# Patient Record
Sex: Female | Born: 1960 | Race: White | Hispanic: No | Marital: Single | Smoking: Former smoker
Health system: Southern US, Community
[De-identification: ages and names within clinical notes are randomized; demographics above are authoritative.]

## PROBLEM LIST (undated history)

## (undated) DIAGNOSIS — E079 Disorder of thyroid, unspecified: Secondary | ICD-10-CM

## (undated) DIAGNOSIS — I10 Essential (primary) hypertension: Secondary | ICD-10-CM

## (undated) DIAGNOSIS — F419 Anxiety disorder, unspecified: Secondary | ICD-10-CM

## (undated) DIAGNOSIS — F32A Depression, unspecified: Secondary | ICD-10-CM

## (undated) DIAGNOSIS — T7840XA Allergy, unspecified, initial encounter: Secondary | ICD-10-CM

## (undated) HISTORY — DX: Disorder of thyroid, unspecified: E07.9

## (undated) HISTORY — DX: Essential (primary) hypertension: I10

## (undated) HISTORY — DX: Depression, unspecified: F32.A

## (undated) HISTORY — DX: Allergy, unspecified, initial encounter: T78.40XA

## (undated) HISTORY — PX: CHOLECYSTECTOMY: SHX55

## (undated) HISTORY — DX: Anxiety disorder, unspecified: F41.9

## (undated) HISTORY — PX: APPENDECTOMY: SHX54

---

## 2016-04-23 ENCOUNTER — Ambulatory Visit: Payer: BLUE CROSS/BLUE SHIELD | Admitting: Internal Medicine

## 2016-04-26 ENCOUNTER — Other Ambulatory Visit (INDEPENDENT_AMBULATORY_CARE_PROVIDER_SITE_OTHER): Payer: BLUE CROSS/BLUE SHIELD

## 2016-04-26 ENCOUNTER — Ambulatory Visit (INDEPENDENT_AMBULATORY_CARE_PROVIDER_SITE_OTHER): Payer: BLUE CROSS/BLUE SHIELD | Admitting: Internal Medicine

## 2016-04-26 ENCOUNTER — Encounter: Payer: Self-pay | Admitting: Internal Medicine

## 2016-04-26 VITALS — BP 112/76 | HR 58 | Temp 98.2°F | Resp 18 | Ht 67.5 in | Wt 227.0 lb

## 2016-04-26 DIAGNOSIS — J301 Allergic rhinitis due to pollen: Secondary | ICD-10-CM

## 2016-04-26 DIAGNOSIS — F3289 Other specified depressive episodes: Secondary | ICD-10-CM

## 2016-04-26 DIAGNOSIS — R740 Nonspecific elevation of levels of transaminase and lactic acid dehydrogenase [LDH]: Secondary | ICD-10-CM | POA: Diagnosis not present

## 2016-04-26 DIAGNOSIS — Z1159 Encounter for screening for other viral diseases: Secondary | ICD-10-CM | POA: Diagnosis not present

## 2016-04-26 DIAGNOSIS — R7401 Elevation of levels of liver transaminase levels: Secondary | ICD-10-CM

## 2016-04-26 DIAGNOSIS — E039 Hypothyroidism, unspecified: Secondary | ICD-10-CM

## 2016-04-26 LAB — CORTISOL: CORTISOL PLASMA: 4.7 ug/dL

## 2016-04-26 LAB — COMPREHENSIVE METABOLIC PANEL
ALBUMIN: 4.2 g/dL (ref 3.5–5.2)
ALT: 57 U/L — ABNORMAL HIGH (ref 0–35)
AST: 32 U/L (ref 0–37)
Alkaline Phosphatase: 133 U/L — ABNORMAL HIGH (ref 39–117)
BUN: 22 mg/dL (ref 6–23)
CHLORIDE: 101 meq/L (ref 96–112)
CO2: 31 mEq/L (ref 19–32)
CREATININE: 1.08 mg/dL (ref 0.40–1.20)
Calcium: 9.7 mg/dL (ref 8.4–10.5)
GFR: 55.72 mL/min — ABNORMAL LOW (ref >60.00)
GLUCOSE: 101 mg/dL — AB (ref 70–99)
Potassium: 4.2 mEq/L (ref 3.5–5.1)
SODIUM: 139 meq/L (ref 135–145)
TOTAL PROTEIN: 7.4 g/dL (ref 6.0–8.3)
Total Bilirubin: 0.3 mg/dL (ref 0.2–1.2)

## 2016-04-26 LAB — TSH: TSH: 4.13 u[IU]/mL (ref 0.35–4.50)

## 2016-04-26 LAB — HEMOGLOBIN A1C: Hgb A1c MFr Bld: 6.1 % (ref 4.6–6.5)

## 2016-04-26 MED ORDER — MONTELUKAST SODIUM 10 MG PO TABS: 10.0000 mg | ORAL_TABLET | Freq: Every day | ORAL | 3 refills | Status: DC

## 2016-04-26 NOTE — Progress Notes (Signed)
   Subjective:    Patient ID: Kristen Rowe, female    DOB: 02-07-1961, 55 y.o.   MRN: [id number]  HPI The patient is a 55 YO female coming in new for sinus problems. She has had them in the past. Normally she will take nose spray over the counter and is still taking that but it is not helping. She is having drainage. Mild cough, no SOB. No ear pain or drainage. No fevers or chills. Going on since she moved here from [place]. 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 Also with some new ALT elevation on recent labs (does not drink alcohol, had all childhood immunizations that she knows of, never had follow up of this, taking some tylenol but not extra before labs).  PMH, [facility], social history reviewed and updated.   Review of Systems  Constitutional: Negative for activity change, appetite change, chills, fatigue, fever and unexpected weight change.  HENT: Positive for congestion and postnasal drip. Negative for ear discharge, ear pain, facial swelling, rhinorrhea, sinus pain, sinus pressure, sore throat and trouble swallowing.   Eyes: Negative.   Respiratory: Positive for cough. Negative for chest tightness, shortness of breath and wheezing.   Cardiovascular: Negative for chest pain, palpitations and leg swelling.  Gastrointestinal: Negative for abdominal distention, abdominal pain, constipation, diarrhea and nausea.  Musculoskeletal: Negative.   Skin: Negative.   Neurological: Negative.   Psychiatric/Behavioral: Negative.       Objective:   Physical Exam  Constitutional:  She is oriented to person, place, and time. She appears well-developed and well-nourished.  HENT:  Head: Normocephalic and atraumatic.  Bilateral TM normal, oropharynx with mild clear drainage, nose without crusting.   Eyes: EOM are normal.  Neck: Normal range of motion. No JVD present.  Cardiovascular: Normal rate and regular rhythm.   Pulmonary/Chest: Effort normal and breath sounds normal. No respiratory distress. She has no wheezes.  Abdominal: Soft. Bowel sounds are normal. She exhibits no distension. There is no tenderness. There is no rebound.  Musculoskeletal: She exhibits no edema.  Lymphadenopathy:    She has no cervical adenopathy.  Neurological: She is alert and oriented to person, place, and time. Coordination normal.  Skin: Skin is warm and dry.  Psychiatric: She has a normal mood and affect.   Vitals:   04/26/16 1425  BP: 112/76  Pulse: (!) 58  Resp: 18  Temp: 98.2 F (36.8 C)  TempSrc: Oral  SpO2: 98%  Weight: 227 lb (103 kg)  Height: 5' 7.5" (1.715 m)      Assessment & Plan:

## 2016-04-26 NOTE — Progress Notes (Signed)
Pre visit review using our clinic review tool, if applicable. No additional management support is needed unless otherwise documented below in the visit note. 

## 2016-04-26 NOTE — Patient Instructions (Signed)
We have sent in singulair for the allegies. Take 1 pill daily to see if this helps some.  Once you are feeling better consider doing some local honey (check labels but most of the regular honey in the grocery store is collected locally) several times per week to help decrease allergy symptoms.  We are checking the labs for the liver and the hepatitis to see if you need the vaccine.

## 2016-04-27 DIAGNOSIS — F32A Depression, unspecified: Secondary | ICD-10-CM | POA: Insufficient documentation

## 2016-04-27 DIAGNOSIS — F329 Major depressive disorder, single episode, unspecified: Secondary | ICD-10-CM | POA: Insufficient documentation

## 2016-04-27 DIAGNOSIS — R740 Nonspecific elevation of levels of transaminase and lactic acid dehydrogenase [LDH]: Secondary | ICD-10-CM

## 2016-04-27 DIAGNOSIS — R7401 Elevation of levels of liver transaminase levels: Secondary | ICD-10-CM | POA: Insufficient documentation

## 2016-04-27 DIAGNOSIS — E039 Hypothyroidism, unspecified: Secondary | ICD-10-CM | POA: Insufficient documentation

## 2016-04-27 DIAGNOSIS — J309 Allergic rhinitis, unspecified: Secondary | ICD-10-CM | POA: Insufficient documentation

## 2016-04-27 LAB — HEPATITIS B SURFACE ANTIBODY,QUALITATIVE: HEP B S AB: NEGATIVE

## 2016-04-27 LAB — HEPATITIS C ANTIBODY: HCV AB: NEGATIVE

## 2016-04-27 NOTE — Assessment & Plan Note (Signed)
Has tried otc zyrtec and nasonex with some relief. Will add singulair to see if this adds more benefit. No indication for antibiotics or steroids at this time.

## 2016-04-27 NOTE — Assessment & Plan Note (Signed)
Recent TSH at goal on synthroid 137 mcg so will continue synthroid daily.

## 2016-04-27 NOTE — Assessment & Plan Note (Addendum)
Elevated on recent labs we reviewed, she does not drink alcohol. Checking screening hep c and hep b immunity and recheck the levels. Could be related to some fatty liver but needs evaluation.

## 2016-04-27 NOTE — Assessment & Plan Note (Signed)
Controlled on wellbutrin and celexa and doing well.

## 2016-04-28 ENCOUNTER — Other Ambulatory Visit: Payer: Self-pay | Admitting: Internal Medicine

## 2016-04-28 DIAGNOSIS — R5383 Other fatigue: Secondary | ICD-10-CM

## 2016-06-16 ENCOUNTER — Telehealth: Payer: Self-pay | Admitting: Internal Medicine

## 2016-06-16 ENCOUNTER — Encounter: Payer: Self-pay | Admitting: Internal Medicine

## 2016-06-16 ENCOUNTER — Ambulatory Visit (INDEPENDENT_AMBULATORY_CARE_PROVIDER_SITE_OTHER): Payer: BLUE CROSS/BLUE SHIELD | Admitting: Internal Medicine

## 2016-06-16 VITALS — BP 136/80 | HR 67 | Temp 98.9°F | Resp 20 | Wt 227.0 lb

## 2016-06-16 DIAGNOSIS — J019 Acute sinusitis, unspecified: Secondary | ICD-10-CM | POA: Diagnosis not present

## 2016-06-16 DIAGNOSIS — J301 Allergic rhinitis due to pollen: Secondary | ICD-10-CM | POA: Diagnosis not present

## 2016-06-16 MED ORDER — HYDROCODONE-HOMATROPINE 5-1.5 MG/5ML PO SYRP: 5.0000 mL | ORAL_SOLUTION | Freq: Four times a day (QID) | ORAL | 0 refills | Status: AC | PRN

## 2016-06-16 MED ORDER — LEVOFLOXACIN 500 MG PO TABS: 500.0000 mg | ORAL_TABLET | Freq: Every day | ORAL | 0 refills | Status: DC

## 2016-06-16 NOTE — Assessment & Plan Note (Signed)
Mild to mod, for antibx course,  to f/u any worsening symptoms or concerns 

## 2016-06-16 NOTE — Telephone Encounter (Signed)
Received a phone call from the answering service, pharmacy report a interaction between Levaquin and citalopram. I advised them to hold  the prescription for now, will send his message to Dr. Cathlean Cower who prescribed Levaquin to see if he likes to send an alternative antibiotic.

## 2016-06-16 NOTE — Progress Notes (Signed)
   Subjective:    Patient ID: Kristen Rowe, female    DOB: 1960-08-15, 56 y.o.   MRN: [id number]  HPI   Here with 2-3 days acute onset fever, facial pain and severe left ear pain, pressure, headache, general weakness and malaise, and greenish d/c, with mild ST and cough, but pt denies chest pain, wheezing, increased sob or doe, orthopnea, PND, increased LE swelling, palpitations, dizziness or syncope. Pt denies new neurological symptoms such as new headache, or facial or extremity weakness or numbness   Pt denies polydipsia, polyuria  Allergy symptoms continue to be well controlled with singulair, asks for refill  No other new history Past Medical History:  Diagnosis Date  . Thyroid disease    Past Surgical History:  Procedure Laterality Date  . APPENDECTOMY    . CHOLECYSTECTOMY      reports that she quit smoking about 7 weeks ago. She has never used smokeless tobacco. She reports that she does not drink alcohol or use drugs. family history includes Cancer in her mother; Heart disease in her father and mother; Hypertension in her brother, father, mother, and sister. No Known Allergies Current Outpatient Prescriptions on File Prior to Visit  Medication Sig Dispense Refill  . buPROPion (WELLBUTRIN XL) 150 MG 24 hr tablet Take 150 mg by mouth daily.   0  . citalopram (CELEXA) 20 MG tablet Take 20 mg by mouth daily.   0  . lisinopril-hydrochlorothiazide (PRINZIDE,ZESTORETIC) 10-12.5 MG tablet Take 1 tablet by mouth daily.   0  . metoprolol (LOPRESSOR) 50 MG tablet Take 50 mg by mouth 2 (two) times daily.    . montelukast (SINGULAIR) 10 MG tablet Take 1 tablet (10 mg total) by mouth at bedtime. 30 tablet 3  . SYNTHROID 137 MCG tablet Take 137 mcg by mouth daily before breakfast.   2  . zolpidem (AMBIEN) 5 MG tablet Take 10 mg by mouth at bedtime as needed for sleep.     No current facility-administered medications on file prior to visit.    Review of Systems All other system neg per pt      Objective:   Physical Exam BP 136/80   Pulse 67   Temp 98.9 F (37.2 C) (Oral)   Resp 20   Wt 227 lb (103 kg)   SpO2 96%   BMI 35.03 kg/m  VS noted, mild ill Constitutional: Pt appears in no apparent distress HENT: Head: NCAT.  Right Ear: External ear normal.  Left Ear: External ear normal.  Bilat tm's with mild to mod erythema left > right.  Max sinus areas mild tender.  Pharynx with mild erythema, no exudate Eyes: . Pupils are equal, round, and reactive to light. Conjunctivae and EOM are normal Neck: Normal range of motion. Neck supple.  Cardiovascular: Normal rate and regular rhythm.   Pulmonary/Chest: Effort normal and breath sounds decreased without rales or wheezing.  Neurological: Pt is alert. Not confused , motor grossly intact Skin: Skin is warm. No rash, no LE edema Psychiatric: Pt behavior is normal. No agitation.  No other new exam findings    Assessment & Plan:

## 2016-06-16 NOTE — Patient Instructions (Signed)
Please take all new medication as prescribed - the antibiotic, and cough medicine if needed  You can also take Mucinex (or it's generic off brand) for congestion, and tylenol as needed for pain.  Please continue all other medications as before, and refills have been done if requested.  Please have the pharmacy call with any other refills you may need.  Please keep your appointments with your specialists as you may have planned   

## 2016-06-16 NOTE — Telephone Encounter (Signed)
Ok to continue the [employer] to notify pharmacy please

## 2016-06-16 NOTE — Progress Notes (Signed)
Pre visit review using our clinic review tool, if applicable. No additional management support is needed unless otherwise documented below in the visit note. 

## 2016-06-17 ENCOUNTER — Encounter: Payer: Self-pay | Admitting: Internal Medicine

## 2016-06-17 NOTE — Telephone Encounter (Signed)
Called pharmacy they are supposed to have the pharmacist call me back.

## 2016-06-18 ENCOUNTER — Telehealth: Payer: Self-pay

## 2016-06-18 MED ORDER — LEVOFLOXACIN 500 MG PO TABS: 500.0000 mg | ORAL_TABLET | Freq: Every day | ORAL | 0 refills | Status: AC

## 2016-06-18 NOTE — Telephone Encounter (Signed)
Medication re sent to pharmacy in [place].

## 2016-06-29 ENCOUNTER — Other Ambulatory Visit: Payer: Self-pay

## 2016-06-29 ENCOUNTER — Other Ambulatory Visit: Payer: Self-pay | Admitting: Internal Medicine

## 2016-06-29 ENCOUNTER — Encounter: Payer: Self-pay | Admitting: Internal Medicine

## 2016-06-29 MED ORDER — ZOLPIDEM TARTRATE 5 MG PO TABS: 10.0000 mg | ORAL_TABLET | Freq: Every evening | ORAL | 0 refills | Status: DC | PRN

## 2016-06-29 NOTE — Telephone Encounter (Signed)
Request for zolpidem 10mg  paper request sent in by [place].

## 2016-07-02 MED ORDER — METOPROLOL TARTRATE 50 MG PO TABS: 50.0000 mg | ORAL_TABLET | Freq: Two times a day (BID) | ORAL | 0 refills | Status: DC

## 2016-07-02 MED ORDER — LISINOPRIL-HYDROCHLOROTHIAZIDE 10-12.5 MG PO TABS: 1.0000 | ORAL_TABLET | Freq: Every day | ORAL | 0 refills | Status: DC

## 2016-07-02 MED ORDER — BUPROPION HCL ER (XL) 150 MG PO TB24: 150.0000 mg | ORAL_TABLET | Freq: Every day | ORAL | 0 refills | Status: DC

## 2016-07-09 ENCOUNTER — Encounter: Payer: Self-pay | Admitting: Internal Medicine

## 2016-07-09 MED ORDER — CITALOPRAM HYDROBROMIDE 20 MG PO TABS: 20.0000 mg | ORAL_TABLET | Freq: Every day | ORAL | 1 refills | Status: DC

## 2016-08-04 ENCOUNTER — Other Ambulatory Visit: Payer: Self-pay | Admitting: Internal Medicine

## 2016-08-04 ENCOUNTER — Encounter: Payer: Self-pay | Admitting: Internal Medicine

## 2016-08-06 ENCOUNTER — Other Ambulatory Visit: Payer: Self-pay | Admitting: Internal Medicine

## 2016-08-23 ENCOUNTER — Ambulatory Visit: Payer: BLUE CROSS/BLUE SHIELD | Admitting: Internal Medicine

## 2016-08-26 ENCOUNTER — Ambulatory Visit (INDEPENDENT_AMBULATORY_CARE_PROVIDER_SITE_OTHER): Payer: BLUE CROSS/BLUE SHIELD | Admitting: Internal Medicine

## 2016-08-26 ENCOUNTER — Encounter: Payer: Self-pay | Admitting: Internal Medicine

## 2016-08-26 DIAGNOSIS — M25571 Pain in right ankle and joints of right foot: Secondary | ICD-10-CM

## 2016-08-26 MED ORDER — ZOLPIDEM TARTRATE 10 MG PO TABS: 10.0000 mg | ORAL_TABLET | Freq: Every evening | ORAL | 2 refills | Status: DC | PRN

## 2016-08-26 NOTE — Patient Instructions (Signed)
We will get you in with Dr. Tamala Julian as soon as possible to help the ankle and look at all the tendons.

## 2016-08-26 NOTE — Progress Notes (Signed)
   Subjective:    Patient ID: Kristen Rowe, female    DOB: [birth year], 56 y.o.   MRN: [id number]  HPI The patient is a 56 YO female coming in for right foot and ankle pain for about 2 years. Getting worse in the last month or so which caused her to come in. Denies any injury in the last month that she is aware of. She has broken the tibia and fibular with repair back in 2013 or 2014 (not sure which) and then hardware removal around 2016. She also tore her left achilles tendon (without known provoked injury) and underwent recovery (no surgery to fix). This foot in now normal and not hurting. Her pain in the right ankle is on the medial aspect of the ankle itself as well as along the achilles tendon and into the calf. No swelling or rash. Worse with walking and worse with extension of the foot. She is taking otc meds for pain when needed and doing okay. She is not able to be off her feet with her job.   Review of Systems  Constitutional: Positive for activity change. Negative for appetite change, chills, fatigue, fever and unexpected weight change.  Respiratory: Negative.   Cardiovascular: Negative.   Gastrointestinal: Negative.   Musculoskeletal: Positive for arthralgias, gait problem and myalgias. Negative for back pain, joint swelling, neck pain and neck stiffness.  Skin: Negative.   Neurological: Negative for dizziness, facial asymmetry, weakness, light-headedness, numbness and headaches.      Objective:   Physical Exam  Constitutional: She appears well-developed and well-nourished.  HENT:  Head: Normocephalic and atraumatic.  Cardiovascular: Normal rate and regular rhythm.   Pulmonary/Chest: Effort normal.  Abdominal: Soft.  Musculoskeletal: She exhibits tenderness.  Pain along the medial right ankle and into the achilles tendon region. Pain does worsen with extension of the foot. Inversion and eversion are both painful. Some pain in the calf muscles but no swelling when compared to the  left. No discrete tear of the achilles but sore to touch.   Skin: Skin is warm and dry.   Vitals:   08/26/16 0847  BP: 130/70  Pulse: 68  Temp: 98.5 F (36.9 C)  TempSrc: Oral  SpO2: 98%  Weight: 221 lb (100.2 kg)  Height: 5' 7.5" (1.715 m)      Assessment & Plan:

## 2016-08-26 NOTE — Progress Notes (Signed)
Pre visit review using our clinic review tool, if applicable. No additional management support is needed unless otherwise documented below in the visit note. 

## 2016-08-27 DIAGNOSIS — M25571 Pain in right ankle and joints of right foot: Secondary | ICD-10-CM | POA: Insufficient documentation

## 2016-08-27 NOTE — Assessment & Plan Note (Signed)
Acute on chronic pain, given past unprovoked achilles tear on the left makes partial tear on the right more likely. Refer to sports medicine for evaluation and treatment. 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

## 2016-09-02 ENCOUNTER — Telehealth: Payer: Self-pay | Admitting: Internal Medicine

## 2016-09-02 MED ORDER — LEVOTHYROXINE SODIUM 137 MCG PO TABS: 137.0000 ug | ORAL_TABLET | Freq: Every day | ORAL | 0 refills | Status: DC

## 2016-09-02 NOTE — Telephone Encounter (Signed)
I am sorry. It said Oct. But when I went in it did say Nov. Patient informed. And to make sure she comes to May appointment to get more refills. Thank you.

## 2016-09-02 NOTE — Telephone Encounter (Signed)
SYNTHROID 137 MCG tablet [[id number]  Patient is requesting a refill on this medication. She as been on this medication. You sent in back in Oct. But her pharmacy did not fill that one. They kept using the refills she had on her rx from Dr. Andree Elk. Could we send this rx in? Thank you.

## 2016-09-02 NOTE — Telephone Encounter (Signed)
Please advise 

## 2016-09-02 NOTE — Telephone Encounter (Signed)
I could not have sent in back in October, she did not establish until November. Will send in for #90 no refills.

## 2016-09-11 NOTE — Progress Notes (Signed)
[facility] [address], [place] [address] Phone: [phone] Subjective:    I'm seeing this patient by the request  of:  Hoyt Koch, MD   CC: Right ankle and foot pain  [id number]  Kristen Rowe is a 56 y.o. female coming in with complaint of right ankle pain. Been on and off for approximately 2 years. Seems to be getting significantly worse than the last 6 weeks. Doesn't remember any true injury. Patient has had a trimalleolar fracture and had hardware removed back in 2016. She is unfortunately also had a left Achilles tendon rupture previously. Patient states never got it fixed. Patient though has had more of a right medial ankle pain. Seems to go posteriorly as well towards the Achilles. No swelling and no rash. Worse with walking long distances. Has been taking over-the-counter medications with mild improvement. Pain 7/10 and worsening, feel like her last achilles tear.       Past Medical History:  Diagnosis Date  . Thyroid disease    Past Surgical History:  Procedure Laterality Date  . APPENDECTOMY    . CHOLECYSTECTOMY     Social History   Social History  . Marital status: Single    Spouse name: N/A  . Number of children: N/A  . Years of education: N/A   Social History Main Topics  . Smoking status: Former Smoker    Quit date: 04/26/2016  . Smokeless tobacco: Never Used  . Alcohol use No  . Drug use: No  . Sexual activity: Not on file   Other Topics Concern  . Not on file   Social History Narrative  . No narrative on file   No Known Allergies Family History  Problem Relation Age of Onset  . Cancer Mother     lung  . Heart disease Mother   . Hypertension Mother   . Heart disease Father   . Hypertension Father   . Hypertension Sister   . Hypertension Brother     Past medical history, social, surgical and family history all reviewed in electronic medical record.  No pertanent information unless  stated regarding to the chief complaint.   Review of Systems:Review of systems updated and as accurate as of 09/11/16  No headache, visual changes, nausea, vomiting, diarrhea, constipation, dizziness, abdominal pain, skin rash, fevers, chills, night sweats, weight loss, swollen lymph nodes, body aches, joint swelling, muscle aches, chest pain, shortness of breath, mood changes.   Objective  There were no vitals taken for this visit. Systems examined below as of 09/11/16   General: No apparent distress alert and oriented x3 mood and affect normal, dressed appropriately.  HEENT: Pupils equal, extraocular movements intact  Respiratory: Patient's speak in full sentences and does not appear short of breath  Cardiovascular: No lower extremity edema, non tender, no erythema  Skin: Warm dry intact with no signs of infection or rash on extremities or on axial skeleton.  Abdomen: Soft nontender  Neuro: Cranial nerves II through XII are intact, neurovascularly intact in all extremities with 2+ DTRs and 2+ pulses.  Lymph: No lymphadenopathy of posterior or anterior cervical chain or axillae bilaterally.  Gait antalgic gait.  MSK:  Non tender with full range of motion and good stability and symmetric strength and tone of shoulders, elbows, wrist, hip, knees bilaterally.  Ankle:right  Swelling over distal achilles. . Range of motion is full in all directions. Strength is 4/5 in flexion compared to contralateral side.  Stable  lateral and medial ligaments; squeeze test and kleiger test unremarkable; Talar dome nontender; No pain at base of 5th MT; No tenderness over cuboid; No tenderness over N spot or navicular prominence No tenderness on posterior aspects of lateral and medial malleolus No sign of peroneal tendon subluxations or tenderness to palpation Negative tarsal tunnel tinel's Able to walk 4 steps antalgic   MSK US performed of: Right ankle and foot This study was ordered, performed, and  interpreted by Charlann Boxer D.O.  Foot/Ankle:   All structures visualized.   Talar dome unremarkable  Achilles tendon that show the patient does have a nodule approximately 2 cm from the insertion. Significant increase in Doppler flow and hypoechoic changes. No true tear.    IMPRESSION:  Achilles tendinitis     Impression and Recommendations:     This case required medical decision making of moderate complexity.      Note: This dictation was prepared with Dragon dictation along with smaller phrase technology. Any transcriptional errors that result from this process are unintentional.

## 2016-09-13 ENCOUNTER — Encounter: Payer: Self-pay | Admitting: Family Medicine

## 2016-09-13 ENCOUNTER — Telehealth: Payer: Self-pay | Admitting: *Deleted

## 2016-09-13 ENCOUNTER — Ambulatory Visit: Payer: Self-pay

## 2016-09-13 ENCOUNTER — Ambulatory Visit (INDEPENDENT_AMBULATORY_CARE_PROVIDER_SITE_OTHER): Payer: BLUE CROSS/BLUE SHIELD | Admitting: Family Medicine

## 2016-09-13 VITALS — BP 158/96 | HR 66 | Ht 67.0 in | Wt 216.0 lb

## 2016-09-13 DIAGNOSIS — M25571 Pain in right ankle and joints of right foot: Secondary | ICD-10-CM

## 2016-09-13 DIAGNOSIS — M2142 Flat foot [pes planus] (acquired), left foot: Secondary | ICD-10-CM | POA: Diagnosis not present

## 2016-09-13 DIAGNOSIS — M2141 Flat foot [pes planus] (acquired), right foot: Secondary | ICD-10-CM | POA: Diagnosis not present

## 2016-09-13 DIAGNOSIS — M2140 Flat foot [pes planus] (acquired), unspecified foot: Secondary | ICD-10-CM | POA: Insufficient documentation

## 2016-09-13 DIAGNOSIS — M7661 Achilles tendinitis, right leg: Secondary | ICD-10-CM | POA: Diagnosis not present

## 2016-09-13 MED ORDER — NITROGLYCERIN 0.2 MG/HR TD PT24
MEDICATED_PATCH | TRANSDERMAL | 1 refills | Status: DC
Start: 2016-09-13 — End: 2017-05-27

## 2016-09-13 MED ORDER — DICLOFENAC SODIUM 2 % TD SOLN: 2.0000 "application " | Freq: Two times a day (BID) | TRANSDERMAL | 3 refills | Status: DC

## 2016-09-13 NOTE — Telephone Encounter (Signed)
Please tell them to fill it and patient knows how to use it.

## 2016-09-13 NOTE — Assessment & Plan Note (Signed)
History of right Achilles tendinitis. Patient does have worsening symptoms at this time.  In [employer]. We discussed icing regimen, topical anti-inflammatory's prescribed. Nitroglycerin patch given and warned of potential side effects. We discussed which activities to do in which ones to avoid. Follow-up again in 2-3 weeks and we'll ultrasound again at follow-up to make sure healing is occurring.

## 2016-09-13 NOTE — Telephone Encounter (Signed)
Called pharmacy spoke w/Jackie pharmacist gave her MD response. She state per manufacturer patches can't be cut, and they are not allowed to fill with those directions. Would you like to send a new rx for 1 patch a day, and just have patient to cut 1/4  Daily.Marland KitchenMarland Kitchen

## 2016-09-13 NOTE — Patient Instructions (Signed)
Good to see you  Ice 20 minutes 2 times daily. Usually after activity and before bed. pennsaid pinkie amount topically 2 times daily as needed.  Wear boot with any walking until I see you again.  Nitroglycerin Protocol   Apply 1/4 nitroglycerin patch to affected area daily.  Change position of patch within the affected area every 24 hours.  You may experience a headache during the first 1-2 weeks of using the patch, these should subside.  If you experience headaches after beginning nitroglycerin patch treatment, you may take your preferred over the counter pain reliever.  Another side effect of the nitroglycerin patch is skin irritation or rash related to patch adhesive.  Please notify our office if you develop more severe headaches or rash, and stop the patch.  Tendon healing with nitroglycerin patch may require 12 to 24 weeks depending on the extent of injury.  Men should not use if taking Viagra, Cialis, or Levitra.   Do not use if you have migraines or rosacea.   See me again in 2-3 weeks and we will then get you doing other exercises.

## 2016-09-13 NOTE — Telephone Encounter (Signed)
Rec'd fax stating received script for Nitroglycerin patches. Requesting new rx can not cut the patches. Pls advise...Johny Chess

## 2016-09-14 NOTE — Telephone Encounter (Signed)
Called pharmacy spoke w/Kelly gave her MD response. Called pt inform to only use 1/4 of the patch a day per Dr. Tamala Julian orders.../lm,b

## 2016-09-14 NOTE — Telephone Encounter (Signed)
Lets just give them verbal to change and then if we can call patient and tell her to continue with 1/4 patch daily. No more then a 1/4 patch daily.

## 2016-09-30 ENCOUNTER — Ambulatory Visit: Payer: BLUE CROSS/BLUE SHIELD | Admitting: Family Medicine

## 2016-10-06 ENCOUNTER — Other Ambulatory Visit: Payer: Self-pay | Admitting: Internal Medicine

## 2016-10-08 ENCOUNTER — Encounter: Payer: Self-pay | Admitting: Internal Medicine

## 2016-10-11 ENCOUNTER — Encounter: Payer: Self-pay | Admitting: Family Medicine

## 2016-10-12 ENCOUNTER — Telehealth: Payer: Self-pay

## 2016-10-12 NOTE — Telephone Encounter (Signed)
Patient called to follow up on this. She stated that the ankle is worse. She wanted to know if there was something she could do or should be doing. Please advise if patient needs an appointment. Thank you.

## 2016-10-12 NOTE — Telephone Encounter (Signed)
Spoke with patient. She said that she has had an increase in her ankle pain. She wore the boot to bed last night which seemed to help improve her pain but she has been using Pennsaid, Ice, and Advil which has not helped. She wants to make sure there is nothing else going on with the sudden increase in pain. Patient worked in on Thursday at 8:30am. Told patient to continue using boot, ice and Advil until seen.

## 2016-10-13 NOTE — Progress Notes (Signed)
[name] [address], [place] [address] Phone: [phone] Subjective:    I'm seeing this patient by the request  of:  Hoyt Koch, MD   CC: Right ankle and foot pain f/u   [id number]  Kristen Rowe is a 56 y.o. female coming in with complaint of right ankle pain. 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 Patient was put in a [employer]. Maybe was making some improvement and then started having severe pain earlier this week. States that he was given her pain even at night. Seems to be more on the medial aspect the ankle. Does not remember any true injury but states that this all seemed to start happening after she started wearing the boot at night. Patient states it is more of a burning sensation. Rates the severity pain is 8 out of 10 but denies any swelling. Denies any new injury, denies any shortness of breath.      Past Medical History:  Diagnosis Date  . Thyroid disease    Past Surgical History:  Procedure Laterality Date  . APPENDECTOMY    . CHOLECYSTECTOMY     Social History   Social History  . Marital status: Single    Spouse name: N/A  . Number of children: N/A  . Years of education: N/A   Social History Main Topics  . Smoking status: Former Smoker    Quit date: 04/26/2016  . Smokeless tobacco: Never Used  . Alcohol use No  . Drug use: No  . Sexual activity: Not Asked   Other Topics Concern  . None   Social History Narrative  . None   No Known Allergies Family History  Problem Relation Age of Onset  . Cancer Mother     lung  . Heart disease Mother   . Hypertension Mother   . Heart disease Father   . Hypertension Father   . Hypertension Sister   . Hypertension Brother     Past medical history, social, surgical and family history all reviewed in electronic medical record.  No pertanent information unless stated regarding to the chief complaint.     Review of Systems: No headache, visual changes, nausea, vomiting, diarrhea, constipation, dizziness, abdominal pain, skin rash, fevers, chills, night sweats, weight loss, swollen lymph nodes, body aches, joint swelling, muscle aches, chest pain, shortness of breath, mood changes.     Objective  Blood pressure 116/86, pulse (!) 56, resp. rate 16, weight 221 lb 4 oz (100.4 kg), SpO2 98 %.   Systems examined below as of 10/14/16 General: NAD A&O x3 mood, affect normal  HEENT: Pupils equal, extraocular movements intact no nystagmus Respiratory: not short of breath at rest or with speaking Cardiovascular: No lower extremity edema, non tender Skin: Warm dry intact with no signs of infection or rash on extremities or on axial skeleton. Abdomen: Soft nontender, no masses Neuro: Cranial nerves  intact, neurovascularly intact in all extremities with 2+ DTRs and 2+ pulses. Lymph: No lymphadenopathy appreciated today  Gait Severely antalgic gait MSK: Non tender with full range of motion and good stability and symmetric strength and tone of shoulders, elbows, wrist,  knee hips bilaterally.   Ankle:right  Swelling over distal achilles fossa laying more than previous exam Range of motion is full in all directions. Strength is 4/5 in flexion compared to contralateral side.  Stable lateral and medial ligaments; squeeze test and kleiger test unremarkable; pain over the talar  dome No pain at base of 5th MT; No tenderness over cuboid; No tenderness over N spot or navicular prominence No tenderness on posterior aspects of lateral and medial malleolus No sign of peroneal tendon subluxations or tenderness to palpation Positive tarsal tunnel Tinel's Able to walk 4 steps antalgic worsening  MSK US performed of: Right ankle and foot This study was ordered, performed, and interpreted by Charlann Boxer D.O.  Foot/Ankle:   Patient still has significant hypoechoic changes of the Achilles tendon with nodule noted  approximately 2 cm above the insertion. Patient also has what appears to be increasing Doppler flow in the area. Patient does have significant enlargement of the posterior tibialis nerve noted today.   IMPRESSION:  Achilles tendinitis and possible signs of tarsal tunnel syndrome     Impression and Recommendations:     This case required medical decision making of moderate complexity.      Note: This dictation was prepared with Dragon dictation along with smaller phrase technology. Any transcriptional errors that result from this process are unintentional.

## 2016-10-14 ENCOUNTER — Ambulatory Visit: Payer: Self-pay

## 2016-10-14 ENCOUNTER — Ambulatory Visit (INDEPENDENT_AMBULATORY_CARE_PROVIDER_SITE_OTHER): Payer: BLUE CROSS/BLUE SHIELD | Admitting: Family Medicine

## 2016-10-14 ENCOUNTER — Encounter: Payer: Self-pay | Admitting: Family Medicine

## 2016-10-14 VITALS — BP 116/86 | HR 56 | Resp 16 | Wt 221.2 lb

## 2016-10-14 DIAGNOSIS — M25571 Pain in right ankle and joints of right foot: Secondary | ICD-10-CM

## 2016-10-14 DIAGNOSIS — M7661 Achilles tendinitis, right leg: Secondary | ICD-10-CM

## 2016-10-14 DIAGNOSIS — G5751 Tarsal tunnel syndrome, right lower limb: Secondary | ICD-10-CM

## 2016-10-14 MED ORDER — GABAPENTIN 100 MG PO CAPS: 200.0000 mg | ORAL_CAPSULE | Freq: Every day | ORAL | 3 refills | Status: DC

## 2016-10-14 MED ORDER — PREDNISONE 50 MG PO TABS: 50.0000 mg | ORAL_TABLET | Freq: Every day | ORAL | 0 refills | Status: DC

## 2016-10-14 NOTE — Progress Notes (Signed)
Pre-visit discussion using our clinic review tool. No additional management support is needed unless otherwise documented below in the visit note.  

## 2016-10-14 NOTE — Assessment & Plan Note (Signed)
Worsening symptoms overall. Encourage patient to place the nitroglycerin closer to the Achilles at this time. Having a reactive tarsal tunnel likely secondary to the immobilization and patient given a brace that I think will be potentially more beneficial. Patient will switch from the brace in the [employer] depending on the amount of activity that she is doing. Avoid any type of high impact exercises. Given range of motion exercises that I think will be more beneficial. Prednisone given as well to decrease the inflammation significantly at this time. Follow-up again in 2-3 weeks for further evaluation and treatment.

## 2016-10-14 NOTE — Patient Instructions (Addendum)
Good to see you  Do not wear the boot to sleep  Ice is still a good idea At home wear the brace with walking  If sitting take boot off and move ankle in many directions.  Boot at work for 3 weeks.  Prednisone daily for 5 days Gabapentin 200mg  at night See me again in 3 weeks.

## 2016-10-14 NOTE — Assessment & Plan Note (Addendum)
We'll consider injections. We'll need custom orthotics in the long run. Gabapentin given as well

## 2016-10-20 ENCOUNTER — Other Ambulatory Visit: Payer: Self-pay | Admitting: Internal Medicine

## 2016-10-21 ENCOUNTER — Other Ambulatory Visit: Payer: Self-pay | Admitting: Internal Medicine

## 2016-10-25 ENCOUNTER — Ambulatory Visit: Payer: BLUE CROSS/BLUE SHIELD | Admitting: Internal Medicine

## 2016-10-28 ENCOUNTER — Ambulatory Visit (INDEPENDENT_AMBULATORY_CARE_PROVIDER_SITE_OTHER): Payer: BLUE CROSS/BLUE SHIELD | Admitting: Family Medicine

## 2016-10-28 ENCOUNTER — Encounter: Payer: Self-pay | Admitting: Internal Medicine

## 2016-10-28 ENCOUNTER — Other Ambulatory Visit (INDEPENDENT_AMBULATORY_CARE_PROVIDER_SITE_OTHER): Payer: BLUE CROSS/BLUE SHIELD

## 2016-10-28 ENCOUNTER — Encounter: Payer: Self-pay | Admitting: Family Medicine

## 2016-10-28 ENCOUNTER — Ambulatory Visit (INDEPENDENT_AMBULATORY_CARE_PROVIDER_SITE_OTHER): Payer: BLUE CROSS/BLUE SHIELD | Admitting: Internal Medicine

## 2016-10-28 ENCOUNTER — Ambulatory Visit: Payer: Self-pay

## 2016-10-28 VITALS — BP 124/84 | HR 66 | Ht 67.0 in

## 2016-10-28 VITALS — BP 134/80 | HR 69 | Temp 98.0°F | Resp 12 | Ht 67.0 in | Wt 219.0 lb

## 2016-10-28 DIAGNOSIS — R740 Nonspecific elevation of levels of transaminase and lactic acid dehydrogenase [LDH]: Secondary | ICD-10-CM

## 2016-10-28 DIAGNOSIS — E039 Hypothyroidism, unspecified: Secondary | ICD-10-CM

## 2016-10-28 DIAGNOSIS — M7661 Achilles tendinitis, right leg: Secondary | ICD-10-CM

## 2016-10-28 DIAGNOSIS — R300 Dysuria: Secondary | ICD-10-CM | POA: Diagnosis not present

## 2016-10-28 DIAGNOSIS — R7301 Impaired fasting glucose: Secondary | ICD-10-CM

## 2016-10-28 DIAGNOSIS — R7401 Elevation of levels of liver transaminase levels: Secondary | ICD-10-CM

## 2016-10-28 DIAGNOSIS — Z23 Encounter for immunization: Secondary | ICD-10-CM

## 2016-10-28 DIAGNOSIS — R5383 Other fatigue: Secondary | ICD-10-CM

## 2016-10-28 DIAGNOSIS — G5751 Tarsal tunnel syndrome, right lower limb: Secondary | ICD-10-CM

## 2016-10-28 LAB — COMPREHENSIVE METABOLIC PANEL
ALK PHOS: 133 U/L — AB (ref 39–117)
ALT: 50 U/L — AB (ref 0–35)
AST: 23 U/L (ref 0–37)
Albumin: 4.5 g/dL (ref 3.5–5.2)
BILIRUBIN TOTAL: 0.5 mg/dL (ref 0.2–1.2)
BUN: 19 mg/dL (ref 6–23)
CO2: 31 meq/L (ref 19–32)
Calcium: 10.2 mg/dL (ref 8.4–10.5)
Chloride: 101 mEq/L (ref 96–112)
Creatinine, Ser: 1.02 mg/dL (ref 0.40–1.20)
GFR: 59.41 mL/min — ABNORMAL LOW (ref >60.00)
GLUCOSE: 113 mg/dL — AB (ref 70–99)
Potassium: 4.7 mEq/L (ref 3.5–5.1)
SODIUM: 138 meq/L (ref 135–145)
TOTAL PROTEIN: 7.7 g/dL (ref 6.0–8.3)

## 2016-10-28 LAB — POC URINALSYSI DIPSTICK (AUTOMATED)
BILIRUBIN UA: NEGATIVE
Blood, UA: NEGATIVE
GLUCOSE UA: NEGATIVE
Ketones, UA: NEGATIVE
NITRITE UA: NEGATIVE
Protein, UA: NEGATIVE
Spec Grav, UA: 1.02 (ref 1.010–1.025)
UROBILINOGEN UA: 0.2 U/dL
pH, UA: 6 (ref 5.0–8.0)

## 2016-10-28 LAB — TSH: TSH: 5.08 u[IU]/mL — AB (ref 0.35–4.50)

## 2016-10-28 LAB — CORTISOL: CORTISOL PLASMA: 5.4 ug/dL

## 2016-10-28 LAB — HEMOGLOBIN A1C: HEMOGLOBIN A1C: 6.5 % (ref 4.6–6.5)

## 2016-10-28 MED ORDER — NITROFURANTOIN MONOHYD MACRO 100 MG PO CAPS: 100.0000 mg | ORAL_CAPSULE | Freq: Two times a day (BID) | ORAL | 0 refills | Status: DC

## 2016-10-28 NOTE — Patient Instructions (Signed)
Good to see you  Have a great trip but wear the boot One more week at work in the boot.  Then transition into the black ankle brace and a heel lift in your shoe Check [email or web address] for the shoe.  Ice is your friend Continue the nitro patch  Exercises 3 times a week.  See me again I n4 weeks to then allow you do everything and get you off the patch

## 2016-10-28 NOTE — Progress Notes (Signed)
[name] [address], [place] [address] Phone: [phone] Subjective:    I'm seeing this patient by the request  of:  Hoyt Koch, MD   CC: Right ankle and foot pain f/u   [id number]  Kristen Rowe is a 56 y.o. female coming in with complaint of right ankle pain. 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 Patient was put in a [employer]. Patient is making significant improvement. States that she is feeling 90% better. Time. Has not been wearing the Cam Walker at home and is only been wearing it at work. Feels like she is doing relatively well at this time.      Past Medical History:  Diagnosis Date  . Thyroid disease    Past Surgical History:  Procedure Laterality Date  . APPENDECTOMY    . CHOLECYSTECTOMY     Social History   Social History  . Marital status: Single    Spouse name: N/A  . Number of children: N/A  . Years of education: N/A   Social History Main Topics  . Smoking status: Former Smoker    Quit date: 04/26/2016  . Smokeless tobacco: Never Used  . Alcohol use No  . Drug use: No  . Sexual activity: Not Asked   Other Topics Concern  . None   Social History Narrative  . None   No Known Allergies Family History  Problem Relation Age of Onset  . Cancer Mother        lung  . Heart disease Mother   . Hypertension Mother   . Heart disease Father   . Hypertension Father   . Hypertension Sister   . Hypertension Brother     Past medical history, social, surgical and family history all reviewed in electronic medical record.  No pertanent information unless stated regarding to the chief complaint.   Review of Systems: No headache, visual changes, nausea, vomiting, diarrhea, constipation, dizziness, abdominal pain, skin rash, fevers, chills, night sweats, weight loss, swollen lymph nodes, body aches, joint swelling, muscle aches, chest pain, shortness  of breath, mood changes.      Objective  Blood pressure 124/84, pulse 66, height 5\' 7"  (1.702 m), SpO2 98 %.   Systems examined below as of 10/28/16 General: NAD A&O x3 mood, affect normal  HEENT: Pupils equal, extraocular movements intact no nystagmus Respiratory: not short of breath at rest or with speaking Cardiovascular: No lower extremity edema, non tender Skin: Warm dry intact with no signs of infection or rash on extremities or on axial skeleton. Abdomen: Soft nontender, no masses Neuro: Cranial nerves  intact, neurovascularly intact in all extremities with 2+ DTRs and 2+ pulses. Lymph: No lymphadenopathy appreciated today  Gait normal with good balance and coordination.  MSK: Non tender with full range of motion and good stability and symmetric strength and tone of shoulders, elbows, wrist,  knee hips bilaterally.   Ankle: No visible erythema or swelling. Still swelling over the distal Achilles. Less tender though Range of motion is full in all directions. Strength is 5/5 in all directions. Stable lateral and medial ligaments; squeeze test and kleiger test unremarkable; Talar dome nontender; No pain at base of 5th MT; No tenderness over cuboid; No tenderness over N spot or navicular prominence No tenderness on posterior aspects of lateral and medial malleolus No sign of peroneal tendon subluxations or tenderness to palpation Negative tarsal tunnel tinel's Able to walk 4 steps.  MSK US performed of: Right ankle and foot This study was ordered, performed, and interpreted by Charlann Boxer D.O.  Foot/Ankle:   Patient's Achilles is significant less hypoechoic changes. Sizing nodule seems to be smaller as well. Still having increasing Doppler flow but does show no neovascularization.   IMPRESSION:  Improving Achilles tendinitis     Impression and Recommendations:     This case required medical decision making of moderate complexity.      Note: This dictation was  prepared with Dragon dictation along with smaller phrase technology. Any transcriptional errors that result from this process are unintentional.

## 2016-10-28 NOTE — Progress Notes (Signed)
   Subjective:    Patient ID: Kristen Rowe, female    DOB: 1960/12/01, 56 y.o.   MRN: [id number]  HPI The patient is a 56 YO female coming in for several concerns including her impaired sugars (previous HgA1c 6.1 on labs, she has not changed much with diet and exercise, worried about getting diabetes in the future), and her dysuria (going on about 4 weeks but she did not want to come in sooner to get evaluated, no fevers or chills, no back pain, no nausea or vomiting, overall stable but not improving, taking more water and azo over the counter without relief), and her previous liver enzymes (elevated some, she did not follow up with hep b series previously, immunity came back negative, works in healthcare, 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   Review of Systems  Constitutional: Negative for activity change, appetite change, chills, fatigue, fever and unexpected weight change.  HENT: Negative.   Respiratory: Negative.   Cardiovascular: Negative.   Gastrointestinal: Negative.   Genitourinary: Positive for dysuria and frequency. Negative for decreased urine volume, difficulty urinating, dyspareunia, pelvic pain and urgency.  Musculoskeletal: Positive for arthralgias and myalgias. Negative for back pain and joint swelling.  Skin: Negative.   Neurological: Negative.   Psychiatric/Behavioral: Negative.       Objective:   Physical Exam  Constitutional: She is oriented to person, place, and time. She appears well-developed and well-nourished.  HENT:  Head: Normocephalic and atraumatic.  Eyes: EOM are normal.  Neck: Normal range of motion.  Cardiovascular: Normal rate and regular rhythm.   Pulmonary/Chest: Effort normal and breath sounds normal.  Abdominal: Soft. Bowel sounds are normal. She exhibits no distension. There is no tenderness. There is no rebound and no guarding.  Musculoskeletal: She exhibits no edema.  Neurological: She is alert and oriented to  person, place, and time.  Skin: Skin is warm and dry.  Psychiatric: She has a normal mood and affect.   Vitals:   10/28/16 1046  BP: 134/80  Pulse: 69  Resp: 12  Temp: 98 F (36.7 C)  TempSrc: Oral  SpO2: 98%  Weight: 219 lb (99.3 kg)  Height: 5\' 7"  (1.702 m)      Assessment & Plan:  Hepatitis B immunization given at visit.

## 2016-10-28 NOTE — Assessment & Plan Note (Signed)
No more numbness. Mom no more swelling. We'll continue to monitor.

## 2016-10-28 NOTE — Assessment & Plan Note (Signed)
Improving at this time. Patient will start to transition into a shoe with a heel lift. We'll monitor for any type of tarsal tunnel syndromes. Patient will continue with the healing. Continue with the nitroglycerin. Follow-up likely start decreasing the patch and consider custom orthotics.

## 2016-10-28 NOTE — Patient Instructions (Addendum)
We have given you the first hepatitis B shot today and you need another in 1 month and then another 2 months after that.   We have sent in macrobid for the urinary infection. Take 1 pill twice a day for 1 week.

## 2016-10-29 DIAGNOSIS — R7301 Impaired fasting glucose: Secondary | ICD-10-CM | POA: Insufficient documentation

## 2016-10-29 DIAGNOSIS — R300 Dysuria: Secondary | ICD-10-CM | POA: Insufficient documentation

## 2016-10-29 NOTE — Assessment & Plan Note (Signed)
Checking TSH and adjust her synthroid as needed.

## 2016-10-29 NOTE — Assessment & Plan Note (Signed)
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 Hep c screening negative.

## 2016-10-29 NOTE — Assessment & Plan Note (Signed)
U/A in the office consistent with infection and rx for macrobid done during visit. No indication of complication or pyelonephritis. Encouraged in the future to seek care sooner instead of waiting 4 weeks for symptoms.

## 2016-10-29 NOTE — Assessment & Plan Note (Signed)
Checking HgA1c and again reminded her of the importance of diet and exercise to help prevent diabetes.

## 2016-11-04 MED ORDER — LISINOPRIL-HYDROCHLOROTHIAZIDE 10-12.5 MG PO TABS: 1.0000 | ORAL_TABLET | Freq: Every day | ORAL | 0 refills | Status: DC

## 2016-11-25 ENCOUNTER — Ambulatory Visit: Payer: Self-pay | Admitting: Family Medicine

## 2016-12-22 ENCOUNTER — Ambulatory Visit: Payer: Self-pay | Admitting: Family Medicine

## 2016-12-22 ENCOUNTER — Other Ambulatory Visit: Payer: Self-pay | Admitting: Internal Medicine

## 2017-01-05 ENCOUNTER — Other Ambulatory Visit: Payer: Self-pay | Admitting: Internal Medicine

## 2017-01-13 ENCOUNTER — Encounter: Payer: Self-pay | Admitting: Internal Medicine

## 2017-01-18 ENCOUNTER — Other Ambulatory Visit: Payer: Self-pay | Admitting: Internal Medicine

## 2017-01-18 NOTE — Telephone Encounter (Signed)
[place] controlled substance database checked.  Ok to fill medication. rx printed 

## 2017-01-18 NOTE — Telephone Encounter (Signed)
Routing to dr burns, please advise in the absence of dr crawford, thanks 

## 2017-01-19 NOTE — Telephone Encounter (Signed)
Faxed to [email or web address]

## 2017-03-09 ENCOUNTER — Encounter: Payer: Self-pay | Admitting: Family

## 2017-03-09 ENCOUNTER — Ambulatory Visit (INDEPENDENT_AMBULATORY_CARE_PROVIDER_SITE_OTHER): Payer: PRIVATE HEALTH INSURANCE | Admitting: Family

## 2017-03-09 ENCOUNTER — Ambulatory Visit (INDEPENDENT_AMBULATORY_CARE_PROVIDER_SITE_OTHER): Payer: PRIVATE HEALTH INSURANCE | Admitting: Family Medicine

## 2017-03-09 ENCOUNTER — Encounter: Payer: Self-pay | Admitting: Family Medicine

## 2017-03-09 VITALS — BP 140/70 | HR 48 | Temp 98.5°F | Resp 16 | Ht 67.0 in | Wt 222.0 lb

## 2017-03-09 DIAGNOSIS — G5751 Tarsal tunnel syndrome, right lower limb: Secondary | ICD-10-CM | POA: Diagnosis not present

## 2017-03-09 DIAGNOSIS — J0140 Acute pansinusitis, unspecified: Secondary | ICD-10-CM | POA: Diagnosis not present

## 2017-03-09 MED ORDER — AMOXICILLIN-POT CLAVULANATE 875-125 MG PO TABS: 1.0000 | ORAL_TABLET | Freq: Two times a day (BID) | ORAL | 0 refills | Status: DC

## 2017-03-09 MED ORDER — HYDROCODONE-HOMATROPINE 5-1.5 MG/5ML PO SYRP: 5.0000 mL | ORAL_SOLUTION | Freq: Three times a day (TID) | ORAL | 0 refills | Status: DC | PRN

## 2017-03-09 MED ORDER — DULOXETINE HCL 20 MG PO CPEP: 20.0000 mg | ORAL_CAPSULE | Freq: Every day | ORAL | 3 refills | Status: DC

## 2017-03-09 NOTE — Assessment & Plan Note (Signed)
Symptoms and exam consistent with acute sinusitis most likely bacterial. Start Augmentin. Start Hycodan as needed for cough and sleep. Continue over-the-counter medications as needed for symptom relief and supportive care. Follow-up if symptoms worsen or do not improve.

## 2017-03-09 NOTE — Assessment & Plan Note (Signed)
I believe patient still has more of a tarsal tunnel syndrome regularly. Encourage patient to do gabapentin regularly. We also discussed Cymbalta. Low dose discontinue patient's citalopram. In addition of this patient will be fitted with custom orthotics severity overpronation. Follow-up again in 4 weeks after the orthotics

## 2017-03-09 NOTE — Patient Instructions (Addendum)
Good to see yo u We will get orthotics for you and call you  Stop the celexa Start the cymbalta which should help a little more on the nerves Gabapentin at least 100mg  at night Stay active.  Avoid being barefoot even in the house [email or web address] x-linked ankle brace size medium  See me again in 5-6 weeks to make sure medicine is treating you well.

## 2017-03-09 NOTE — Progress Notes (Signed)
Subjective:    Patient ID: Kristen Rowe, female    DOB: [birth year], 55 y.o.   MRN: [id number]  Chief Complaint  Patient presents with  . Otalgia    starting saturday, ear ache sore throat, cough, fatigue and congestion     HPI:  Kristen Rowe is a 56 y.o. female who  has a past medical history of Thyroid disease. and presents today for an acute office visit,  This is a new problem. Associated symptoms of sore throat, cough, fatigue and congestion have been going on for about 4 days. No fevers. Modifying factors include Vitamin C, Alkaseltzer cold, Eucineca, and Tylenol which have not helped very much. Course of the symptoms are generally staying about the same. Has similar symptoms every year and is traditionally treated with an antibiotic and cough syrup.   No Known Allergies    Outpatient Medications Prior to Visit  Medication Sig Dispense Refill  . buPROPion (WELLBUTRIN XL) 150 MG 24 hr tablet TAKE ONE TABLET BY MOUTH ONCE DAILY 90 tablet 1  . Diclofenac Sodium (PENNSAID) 2 % SOLN Place 2 application onto the skin 2 (two) times daily. 112 g 3  . DULoxetine (CYMBALTA) 20 MG capsule Take 1 capsule (20 mg total) by mouth daily. 30 capsule 3  . gabapentin (NEURONTIN) 100 MG capsule Take 2 capsules (200 mg total) by mouth at bedtime. 60 capsule 3  . lisinopril-hydrochlorothiazide (PRINZIDE,ZESTORETIC) 10-12.5 MG tablet Take 1 tablet by mouth daily. 90 tablet 0  . metoprolol tartrate (LOPRESSOR) 50 MG tablet TAKE ONE TABLET BY MOUTH TWICE DAILY 180 tablet 0  . montelukast (SINGULAIR) 10 MG tablet Take 1 tablet (10 mg total) by mouth at bedtime. 30 tablet 3  . nitroGLYCERIN (NITRODUR - DOSED IN MG/24 HR) 0.2 mg/hr patch 1/4 patch daily 30 patch 1  . SYNTHROID 137 MCG tablet TAKE 1 TABLET BY MOUTH ONCE DAILY BEFORE  BREAKFAST 90 tablet 0  . zolpidem (AMBIEN) 10 MG tablet TAKE 1 TABLET BY MOUTH AT BEDTIME AS NEEDED FOR SLEEP 30 tablet 2  . lisinopril-hydrochlorothiazide  (PRINZIDE,ZESTORETIC) 10-12.5 MG tablet TAKE ONE TABLET BY MOUTH ONCE DAILY 90 tablet 0  . nitrofurantoin, macrocrystal-monohydrate, (MACROBID) 100 MG capsule Take 1 capsule (100 mg total) by mouth 2 (two) times daily. 14 capsule 0   No facility-administered medications prior to visit.      Past Medical History:  Diagnosis Date  . Thyroid disease      Review of Systems  Constitutional: Negative for chills and fever.  HENT: Positive for congestion, ear pain, sinus pain, sinus pressure and sore throat.   Respiratory: Positive for cough. Negative for chest tightness, shortness of breath and wheezing.   Cardiovascular: Negative for chest pain, palpitations and leg swelling.  Neurological: Positive for headaches.      Objective:    BP 140/70 (BP Location: Left Arm, Patient Position: Sitting, Cuff Size: Large)   Pulse (!) 48   Temp 98.5 F (36.9 C) (Oral)   Resp 16   Ht 5\' 7"  (1.702 m)   Wt 222 lb (100.7 kg)   SpO2 98%   BMI 34.77 kg/m  Nursing note and vital signs reviewed.  Physical Exam  Constitutional: She is oriented to person, place, and time. She appears well-developed and well-nourished.  HENT:  Right Ear: Hearing, tympanic membrane, external ear and ear canal normal.  Left Ear: Hearing, tympanic membrane, external ear and ear canal normal.  Nose: Right sinus exhibits maxillary sinus tenderness and frontal sinus tenderness. Left  sinus exhibits maxillary sinus tenderness and frontal sinus tenderness.  Mouth/Throat: Uvula is midline, oropharynx is clear and moist and mucous membranes are normal.  Neck: Neck supple.  Cardiovascular: Normal rate, regular rhythm, normal heart sounds and intact distal pulses.   Pulmonary/Chest: Effort normal and breath sounds normal.  Neurological: She is alert and oriented to person, place, and time.  Skin: Skin is warm and dry.       Assessment & Plan:   Problem List Items Addressed This Visit      Respiratory   Acute  non-recurrent pansinusitis - Primary    Symptoms and exam consistent with acute sinusitis most likely bacterial. Start Augmentin. Start Hycodan as needed for cough and sleep. Continue over-the-counter medications as needed for symptom relief and supportive care. Follow-up if symptoms worsen or do not improve.      Relevant Medications   HYDROcodone-homatropine (HYCODAN) 5-1.5 MG/5ML syrup   amoxicillin-clavulanate (AUGMENTIN) 875-125 MG tablet       I have discontinued Ms. Pariseau's nitrofurantoin (macrocrystal-monohydrate). I am also having her start on HYDROcodone-homatropine and amoxicillin-clavulanate. Additionally, I am having her maintain her montelukast, nitroGLYCERIN, Diclofenac Sodium, buPROPion, gabapentin, lisinopril-hydrochlorothiazide, SYNTHROID, metoprolol tartrate, zolpidem, and DULoxetine.   Meds ordered this encounter  Medications  . HYDROcodone-homatropine (HYCODAN) 5-1.5 MG/5ML syrup    Sig: Take 5 mLs by mouth every 8 (eight) hours as needed for cough.    Dispense:  120 mL    Refill:  0    Order Specific Question:   Supervising Provider    Answer:   Pricilla Holm A [[id number]]  . amoxicillin-clavulanate (AUGMENTIN) 875-125 MG tablet    Sig: Take 1 tablet by mouth 2 (two) times daily.    Dispense:  14 tablet    Refill:  0    Order Specific Question:   Supervising Provider    Answer:   Pricilla Holm A [[id number]]     Follow-up: Return if symptoms worsen or fail to improve.  Mauricio Po, FNP

## 2017-03-09 NOTE — Patient Instructions (Addendum)
Thank you for choosing [facility].  SUMMARY AND INSTRUCTIONS:  Start taking the Augmentin twice daily until completed.  Hycodan syrup as needed for cough/sleep.   Medication:  Your prescription(s) have been submitted to your pharmacy or been printed and provided for you. Please take as directed and contact our office if you believe you are having problem(s) with the medication(s) or have any questions.  Follow up:  If your symptoms worsen or fail to improve, please contact our office for further instruction, or in case of emergency go directly to the emergency room at the closest medical facility.    General Recommendations:    Please drink plenty of fluids.  Get plenty of rest   Sleep in humidified air  Use saline nasal sprays  Netti pot   OTC Medications:  Decongestants - helps relieve congestion   Flonase (generic fluticasone) or Nasacort (generic triamcinolone) - please make sure to use the "cross-over" technique at a 45 degree angle towards the opposite eye as opposed to straight up the nasal passageway.   Sudafed (generic pseudoephedrine - Note this is the one that is available behind the pharmacy counter); Products with phenylephrine (-PE) may also be used but is often not as effective as pseudoephedrine.   If you have HIGH BLOOD PRESSURE - Coricidin HBP; AVOID any product that is -D as this contains pseudoephedrine which may increase your blood pressure.  Afrin (oxymetazoline) every 6-8 hours for up to 3 days.   Allergies - helps relieve runny nose, itchy eyes and sneezing   Claritin (generic loratidine), Allegra (fexofenidine), or Zyrtec (generic cyrterizine) for runny nose. These medications should not cause drowsiness.  Note - Benadryl (generic diphenhydramine) may be used however may cause drowsiness  Cough -   Delsym or Robitussin (generic dextromethorphan)  Expectorants - helps loosen mucus to ease removal   Mucinex (generic guaifenesin)  as directed on the package.  Headaches / General Aches   Tylenol (generic acetaminophen) - DO NOT EXCEED 3 grams (3,000 mg) in a 24 hour time period  Advil/Motrin (generic ibuprofen)   Sore Throat -   Salt water gargle   Chloraseptic (generic benzocaine) spray or lozenges / Sucrets (generic dyclonine)    Sinusitis Sinusitis is redness, soreness, and inflammation of the paranasal sinuses. Paranasal sinuses are air pockets within the bones of your face (beneath the eyes, the middle of the forehead, or above the eyes). In healthy paranasal sinuses, mucus is able to drain out, and air is able to circulate through them by way of your nose. However, when your paranasal sinuses are inflamed, mucus and air can become trapped. This can allow bacteria and other germs to grow and cause infection. Sinusitis can develop quickly and last only a short time (acute) or continue over a long period (chronic). Sinusitis that lasts for more than 12 weeks is considered chronic.  CAUSES  Causes of sinusitis include:  Allergies.  Structural abnormalities, such as displacement of the cartilage that separates your nostrils (deviated septum), which can decrease the air flow through your nose and sinuses and affect sinus drainage.  Functional abnormalities, such as when the small hairs (cilia) that line your sinuses and help remove mucus do not work properly or are not present. SIGNS AND SYMPTOMS  Symptoms of acute and chronic sinusitis are the same. The primary symptoms are pain and pressure around the affected sinuses. Other symptoms include:  Upper toothache.  Earache.  Headache.  Bad breath.  Decreased sense of smell and taste.  A cough, which worsens when you are lying flat.  Fatigue.  Fever.  Thick drainage from your nose, which often is green and may contain pus (purulent).  Swelling and warmth over the affected sinuses. DIAGNOSIS  Your health care provider will perform a physical exam.  During the exam, your health care provider may:  Look in your nose for signs of abnormal growths in your nostrils (nasal polyps).  Tap over the affected sinus to check for signs of infection.  View the inside of your sinuses (endoscopy) using an imaging device that has a light attached (endoscope). If your health care provider suspects that you have chronic sinusitis, one or more of the following tests may be recommended:  Allergy tests.  Nasal culture. A sample of mucus is taken from your nose, sent to a lab, and screened for bacteria.  Nasal cytology. A sample of mucus is taken from your nose and examined by your health care provider to determine if your sinusitis is related to an allergy. TREATMENT  Most cases of acute sinusitis are related to a viral infection and will resolve on their own within 10 days. Sometimes medicines are prescribed to help relieve symptoms (pain medicine, decongestants, nasal steroid sprays, or saline sprays).  However, for sinusitis related to a bacterial infection, your health care provider will prescribe antibiotic medicines. These are medicines that will help kill the bacteria causing the infection.  Rarely, sinusitis is caused by a fungal infection. In theses cases, your health care provider will prescribe antifungal medicine. For some cases of chronic sinusitis, surgery is needed. Generally, these are cases in which sinusitis recurs more than 3 times per year, despite other treatments. HOME CARE INSTRUCTIONS   Drink plenty of water. Water helps thin the mucus so your sinuses can drain more easily.  Use a humidifier.  Inhale steam 3 to 4 times a day (for example, sit in the bathroom with the shower running).  Apply a warm, moist washcloth to your face 3 to 4 times a day, or as directed by your health care provider.  Use saline nasal sprays to help moisten and clean your sinuses.  Take medicines only as directed by your health care provider.  If you  were prescribed either an antibiotic or antifungal medicine, finish it all even if you start to feel better. SEEK IMMEDIATE MEDICAL CARE IF:  You have increasing pain or severe headaches.  You have nausea, vomiting, or drowsiness.  You have swelling around your face.  You have vision problems.  You have a stiff neck.  You have difficulty breathing. MAKE SURE YOU:   Understand these instructions.  Will watch your condition.  Will get help right away if you are not doing well or get worse. Document Released: 05/31/2005 Document Revised: 10/15/2013 Document Reviewed: 06/15/2011 [facility] Patient Information 2015 [place], [place]. This information is not intended to replace advice given to you by your health care provider. Make sure you discuss any questions you have with your health care provider.

## 2017-03-09 NOTE — Progress Notes (Signed)
Kristen Rowe Kristen Rowe, [place] [address] Phone: [phone] Subjective:    I'm seeing this patient by the request  of:    CC: Ankle pain.  QMG:[id number]  Kristen Rowe is a 56 y.o. female coming in with complaint of ankle pain. Says her ankle is doing better. Patient was seen previously and did have a right Achilles tendinitis but importantly also had what seemed to be a tarsal tunnel syndrome. Patient was responding fairly well to conservative therapy. Taking gabapentin intermittently. Continues to have pain though more with the tarsal tunnel. Sometimes as a sharp pain and has had swelling and even radiation of pain up and down the leg at intermittently. Feels the Achilles is doing better. She wears her orthotics all the time she seems to do better. Orthotics or 56 years old.      Past Medical History:  Diagnosis Date  . Thyroid disease    Past Surgical History:  Procedure Laterality Date  . APPENDECTOMY    . CHOLECYSTECTOMY     Social History   Social History  . Marital status: Single    Spouse name: N/A  . Number of children: N/A  . Years of education: N/A   Social History Main Topics  . Smoking status: Former Smoker    Quit date: 04/26/2016  . Smokeless tobacco: Never Used  . Alcohol use No  . Drug use: No  . Sexual activity: Not on file   Other Topics Concern  . Not on file   Social History Narrative  . No narrative on file   No Known Allergies Family History  Problem Relation Age of Onset  . Cancer Mother        lung  . Heart disease Mother   . Hypertension Mother   . Heart disease Father   . Hypertension Father   . Hypertension Sister   . Hypertension Brother      Past medical history, social, surgical and family history all reviewed in electronic medical record.  No pertanent information unless stated regarding to the chief complaint.   Review of Systems:Review of systems updated and as accurate as of  03/09/17  No headache, visual changes, nausea, vomiting, diarrhea, constipation, dizziness, abdominal pain, skin rash, fevers, chills, night sweats, weight loss, swollen lymph nodes, body aches, joint swelling, chest pain, shortness of breath, mood changes. Positive muscle aches  Objective  There were no vitals taken for this visit. Systems examined below as of 03/09/17   General: No apparent distress alert and oriented x3 mood and affect normal, dressed appropriately.  HEENT: Pupils equal, extraocular movements intact  Respiratory: Patient's speak in full sentences and does not appear short of breath  Cardiovascular: No lower extremity edema, non tender, no erythema  Skin: Warm dry intact with no signs of infection or rash on extremities or on axial skeleton.  Abdomen: Soft nontender  Neuro: Cranial nerves II through XII are intact, neurovascularly intact in all extremities with 2+ DTRs and 2+ pulses.  Lymph: No lymphadenopathy of posterior or anterior cervical chain or axillae bilaterally.  Gait normal with good balance and coordination.  MSK:  Non tender with full range of motion and good stability and symmetric strength and tone of shoulders, elbows, wrist, hip, knee and bilaterally.  Ankle: Right No visible erythema or swelling. Range of motion is full in all directions. Strength is 5/5 in all directions. Stable lateral and medial ligaments; squeeze test and kleiger test unremarkable; Talar dome  minimally tender; No pain at base of 5th MT; No tenderness over cuboid; No tenderness over N spot or navicular prominence No tenderness on posterior aspects of lateral and medial malleolus No sign of peroneal tendon subluxations or tenderness to palpation Positive tarsal tunnel Tinel's Able to walk 4 steps. Overpronation of the hindfoot and midfoot noted. Contralateral ankle show some overpronation but no pain will range of motion and full strength     Impression and Recommendations:      This case required medical decision making of moderate complexity.      Note: This dictation was prepared with Dragon dictation along with smaller phrase technology. Any transcriptional errors that result from this process are unintentional.

## 2017-03-17 ENCOUNTER — Encounter: Payer: Self-pay | Admitting: Family Medicine

## 2017-03-17 NOTE — Progress Notes (Deleted)
Procedure Note   Patient was fitted for a : standard, cushioned, semi-rigid orthotic. The orthotic was heated and afterward the patient patient seated position and molded The patient was positioned in subtalar neutral position and 10 degrees of ankle dorsiflexion in a weight bearing stance. After completion of molding, patient did have orthotic management The blank was ground to a stable position for weight bearing. Size: Base: Carbon fiber Additional Posting and Padding:  The patient ambulated these, and they were very comfortable.

## 2017-03-18 ENCOUNTER — Ambulatory Visit: Payer: PRIVATE HEALTH INSURANCE | Admitting: Family Medicine

## 2017-03-20 ENCOUNTER — Encounter: Payer: Self-pay | Admitting: Internal Medicine

## 2017-03-20 DIAGNOSIS — Z0000 Encounter for general adult medical examination without abnormal findings: Secondary | ICD-10-CM

## 2017-03-25 ENCOUNTER — Ambulatory Visit (INDEPENDENT_AMBULATORY_CARE_PROVIDER_SITE_OTHER): Payer: PRIVATE HEALTH INSURANCE | Admitting: Internal Medicine

## 2017-03-25 ENCOUNTER — Encounter: Payer: Self-pay | Admitting: Internal Medicine

## 2017-03-25 VITALS — BP 140/88 | HR 67 | Temp 97.7°F | Ht 67.0 in | Wt 221.6 lb

## 2017-03-25 DIAGNOSIS — E039 Hypothyroidism, unspecified: Secondary | ICD-10-CM

## 2017-03-25 DIAGNOSIS — R7301 Impaired fasting glucose: Secondary | ICD-10-CM | POA: Diagnosis not present

## 2017-03-25 DIAGNOSIS — Z0000 Encounter for general adult medical examination without abnormal findings: Secondary | ICD-10-CM | POA: Insufficient documentation

## 2017-03-25 DIAGNOSIS — F3289 Other specified depressive episodes: Secondary | ICD-10-CM

## 2017-03-25 MED ORDER — ESZOPICLONE 2 MG PO TABS
1.0000 mg | ORAL_TABLET | Freq: Every evening | ORAL | 3 refills | Status: DC | PRN
Start: 2017-03-25 — End: 2017-12-08

## 2017-03-25 NOTE — Assessment & Plan Note (Signed)
Checking TSH and free T4, also cortisol for symptoms about sweating.

## 2017-03-25 NOTE — Assessment & Plan Note (Signed)
Getting flu shot at work, declines tetanus. Counseled about shingrix. Colonoscopy and mammogram and pap smear up to date. Counseled about sun safety and mole surveillance. Given screening recommendations.

## 2017-03-25 NOTE — Assessment & Plan Note (Signed)
Checking HgA1c. 

## 2017-03-25 NOTE — Progress Notes (Signed)
   Subjective:    Patient ID: Kristen Rowe, female    DOB: Jun 03, 1961, 56 y.o.   MRN: [id number]  HPI The patient is a 56 YO female coming in for physical. Some new concerns.   PMH, [facility], social history reviewed and updated.   Review of Systems  Constitutional: Negative.   HENT: Negative.   Eyes: Negative.   Respiratory: Negative for cough, chest tightness and shortness of breath.   Cardiovascular: Negative for chest pain, palpitations and leg swelling.  Gastrointestinal: Negative for abdominal distention, abdominal pain, constipation, diarrhea, nausea and vomiting.  Endocrine: Positive for heat intolerance.       Sweating  Musculoskeletal: Negative.   Skin: Negative.   Neurological: Negative.   Psychiatric/Behavioral: Negative.       Objective:   Physical Exam  Constitutional: She is oriented to person, place, and time. She appears well-developed and well-nourished.  HENT:  Head: Normocephalic and atraumatic.  Eyes: EOM are normal.  Neck: Normal range of motion.  Cardiovascular: Normal rate and regular rhythm.   Pulmonary/Chest: Effort normal and breath sounds normal. No respiratory distress. She has no wheezes. She has no rales.  Abdominal: Soft. Bowel sounds are normal. She exhibits no distension. There is no tenderness. There is no rebound.  Musculoskeletal: She exhibits no edema.  Neurological: She is alert and oriented to person, place, and time. Coordination normal.  Skin: Skin is warm and dry.  Psychiatric: She has a normal mood and affect.   Vitals:   03/25/17 1302  BP: 140/88  Pulse: 67  Temp: 97.7 F (36.5 C)  TempSrc: Oral  SpO2: 98%  Weight: 221 lb 9.6 oz (100.5 kg)  Height: 5\' 7"  (1.702 m)      Assessment & Plan:

## 2017-03-25 NOTE — Assessment & Plan Note (Signed)
Taking wellbutrin and cymbalta and doing well.

## 2017-03-25 NOTE — Patient Instructions (Addendum)
We will check the labs. We have given you a new medicine for the sleeping.   Health Maintenance, Female Adopting a healthy lifestyle and getting preventive care can go a long way to promote health and wellness. Talk with your health care provider about what schedule of regular examinations is right for you. This is a good chance for you to check in with your provider about disease prevention and staying healthy. In between checkups, there are plenty of things you can do on your own. Experts have done a lot of research about which lifestyle changes and preventive measures are most likely to keep you healthy. Ask your health care provider for more information. Weight and diet Eat a healthy diet  Be sure to include plenty of vegetables, fruits, low-fat dairy products, and lean protein.  Do not eat a lot of foods high in solid fats, added sugars, or salt.  Get regular exercise. This is one of the most important things you can do for your health. ? Most adults should exercise for at least 150 minutes each week. The exercise should increase your heart rate and make you sweat (moderate-intensity exercise). ? Most adults should also do strengthening exercises at least twice a week. This is in addition to the moderate-intensity exercise.  Maintain a healthy weight  Body mass index (BMI) is a measurement that can be used to identify possible weight problems. It estimates body fat based on height and weight. Your health care provider can help determine your BMI and help you achieve or maintain a healthy weight.  For females 40 years of age and older: ? A BMI below 18.5 is considered underweight. ? A BMI of 18.5 to 24.9 is normal. ? A BMI of 25 to 29.9 is considered overweight. ? A BMI of 30 and above is considered obese.  Watch levels of cholesterol and blood lipids  You should start having your blood tested for lipids and cholesterol at 57 years of age, then have this test every 5 years.  You may  need to have your cholesterol levels checked more often if: ? Your lipid or cholesterol levels are high. ? You are older than 57 years of age. ? You are at high risk for heart disease.  Cancer screening Lung Cancer  Lung cancer screening is recommended for adults 61-92 years old who are at high risk for lung cancer because of a history of smoking.  A yearly low-dose CT scan of the lungs is recommended for people who: ? Currently smoke. ? Have quit within the past 15 years. ? Have at least a 30-pack-year history of smoking. A pack year is smoking an average of one pack of cigarettes a day for 1 year.  Yearly screening should continue until it has been 15 years since you quit.  Yearly screening should stop if you develop a health problem that would prevent you from having lung cancer treatment.  Breast Cancer  Practice breast self-awareness. This means understanding how your breasts normally appear and feel.  It also means doing regular breast self-exams. Let your health care provider know about any changes, no matter how small.  If you are in your 20s or 30s, you should have a clinical breast exam (CBE) by a health care provider every 1-3 years as part of a regular health exam.  If you are 17 or older, have a CBE every year. Also consider having a breast X-ray (mammogram) every year.  If you have a family history of breast  cancer, talk to your health care provider about genetic screening.  If you are at high risk for breast cancer, talk to your health care provider about having an MRI and a mammogram every year.  Breast cancer gene (BRCA) assessment is recommended for women who have family members with BRCA-related cancers. BRCA-related cancers include: ? Breast. ? Ovarian. ? Tubal. ? Peritoneal cancers.  Results of the assessment will determine the need for genetic counseling and BRCA1 and BRCA2 testing.  Cervical Cancer Your health care provider may recommend that you be  screened regularly for cancer of the pelvic organs (ovaries, uterus, and vagina). This screening involves a pelvic examination, including checking for microscopic changes to the surface of your cervix (Pap test). You may be encouraged to have this screening done every 3 years, beginning at age 35.  For women ages 30-65, health care providers may recommend pelvic exams and Pap testing every 3 years, or they may recommend the Pap and pelvic exam, combined with testing for human papilloma virus (HPV), every 5 years. Some types of HPV increase your risk of cervical cancer. Testing for HPV may also be done on women of any age with unclear Pap test results.  Other health care providers may not recommend any screening for nonpregnant women who are considered low risk for pelvic cancer and who do not have symptoms. Ask your health care provider if a screening pelvic exam is right for you.  If you have had past treatment for cervical cancer or a condition that could lead to cancer, you need Pap tests and screening for cancer for at least 20 years after your treatment. If Pap tests have been discontinued, your risk factors (such as having a new sexual partner) need to be reassessed to determine if screening should resume. Some women have medical problems that increase the chance of getting cervical cancer. In these cases, your health care provider may recommend more frequent screening and Pap tests.  Colorectal Cancer  This type of cancer can be detected and often prevented.  Routine colorectal cancer screening usually begins at 58 years of age and continues through 57 years of age.  Your health care provider may recommend screening at an earlier age if you have risk factors for colon cancer.  Your health care provider may also recommend using home test kits to check for hidden blood in the stool.  A small camera at the end of a tube can be used to examine your colon directly (sigmoidoscopy or colonoscopy).  This is done to check for the earliest forms of colorectal cancer.  Routine screening usually begins at age 45.  Direct examination of the colon should be repeated every 5-10 years through 57 years of age. However, you may need to be screened more often if early forms of precancerous polyps or small growths are found.  Skin Cancer  Check your skin from head to toe regularly.  Tell your health care provider about any new moles or changes in moles, especially if there is a change in a mole's shape or color.  Also tell your health care provider if you have a mole that is larger than the size of a pencil eraser.  Always use sunscreen. Apply sunscreen liberally and repeatedly throughout the day.  Protect yourself by wearing long sleeves, pants, a wide-brimmed hat, and sunglasses whenever you are outside.  Heart disease, diabetes, and high blood pressure  High blood pressure causes heart disease and increases the risk of stroke. High blood pressure  is more likely to develop in: ? People who have blood pressure in the high end of the normal range (130-139/85-89 mm Hg). ? People who are overweight or obese. ? People who are African American.  If you are 71-68 years of age, have your blood pressure checked every 3-5 years. If you are 27 years of age or older, have your blood pressure checked every year. You should have your blood pressure measured twice-once when you are at a hospital or clinic, and once when you are not at a hospital or clinic. Record the average of the two measurements. To check your blood pressure when you are not at a hospital or clinic, you can use: ? An automated blood pressure machine at a pharmacy. ? A home blood pressure monitor.  If you are between 3 years and 42 years old, ask your health care provider if you should take aspirin to prevent strokes.  Have regular diabetes screenings. This involves taking a blood sample to check your fasting blood sugar level. ? If  you are at a normal weight and have a low risk for diabetes, have this test once every three years after 57 years of age. ? If you are overweight and have a high risk for diabetes, consider being tested at a younger age or more often. Preventing infection Hepatitis B  If you have a higher risk for hepatitis B, you should be screened for this virus. You are considered at high risk for hepatitis B if: ? You were born in a country where hepatitis B is common. Ask your health care provider which countries are considered high risk. ? Your parents were born in a high-risk country, and you have not been immunized against hepatitis B (hepatitis B vaccine). ? You have HIV or AIDS. ? You use needles to inject street drugs. ? You live with someone who has hepatitis B. ? You have had sex with someone who has hepatitis B. ? You get hemodialysis treatment. ? You take certain medicines for conditions, including cancer, organ transplantation, and autoimmune conditions.  Hepatitis C  Blood testing is recommended for: ? Everyone born from [birth year] through 1966. ? Anyone with known risk factors for hepatitis C.  Sexually transmitted infections (STIs)  You should be screened for sexually transmitted infections (STIs) including gonorrhea and chlamydia if: ? You are sexually active and are younger than 57 years of age. ? You are older than 57 years of age and your health care provider tells you that you are at risk for this type of infection. ? Your sexual activity has changed since you were last screened and you are at an increased risk for chlamydia or gonorrhea. Ask your health care provider if you are at risk.  If you do not have HIV, but are at risk, it may be recommended that you take a prescription medicine daily to prevent HIV infection. This is called pre-exposure prophylaxis (PrEP). You are considered at risk if: ? You are sexually active and do not regularly use condoms or know the HIV status of your  partner(s). ? You take drugs by injection. ? You are sexually active with a partner who has HIV.  Talk with your health care provider about whether you are at high risk of being infected with HIV. If you choose to begin PrEP, you should first be tested for HIV. You should then be tested every 3 months for as long as you are taking PrEP. Pregnancy  If you are premenopausal and  you may become pregnant, ask your health care provider about preconception counseling.  If you may become pregnant, take 400 to 800 micrograms (mcg) of folic acid every day.  If you want to prevent pregnancy, talk to your health care provider about birth control (contraception). Osteoporosis and menopause  Osteoporosis is a disease in which the bones lose minerals and strength with aging. This can result in serious bone fractures. Your risk for osteoporosis can be identified using a bone density scan.  If you are 59 years of age or older, or if you are at risk for osteoporosis and fractures, ask your health care provider if you should be screened.  Ask your health care provider whether you should take a calcium or vitamin D supplement to lower your risk for osteoporosis.  Menopause may have certain physical symptoms and risks.  Hormone replacement therapy may reduce some of these symptoms and risks. Talk to your health care provider about whether hormone replacement therapy is right for you. Follow these instructions at home:  Schedule regular health, dental, and eye exams.  Stay current with your immunizations.  Do not use any tobacco products including cigarettes, chewing tobacco, or electronic cigarettes.  If you are pregnant, do not drink alcohol.  If you are breastfeeding, limit how much and how often you drink alcohol.  Limit alcohol intake to no more than 1 drink per day for nonpregnant women. One drink equals 12 ounces of beer, 5 ounces of wine, or 1 ounces of hard liquor.  Do not use street  drugs.  Do not share needles.  Ask your health care provider for help if you need support or information about quitting drugs.  Tell your health care provider if you often feel depressed.  Tell your health care provider if you have ever been abused or do not feel safe at home. This information is not intended to replace advice given to you by your health care provider. Make sure you discuss any questions you have with your health care provider. Document Released: 12/14/2010 Document Revised: 11/06/2015 Document Reviewed: 03/04/2015 Elsevier Interactive Patient Education  [employer].

## 2017-03-29 ENCOUNTER — Other Ambulatory Visit (INDEPENDENT_AMBULATORY_CARE_PROVIDER_SITE_OTHER): Payer: PRIVATE HEALTH INSURANCE

## 2017-03-29 DIAGNOSIS — E039 Hypothyroidism, unspecified: Secondary | ICD-10-CM

## 2017-03-29 DIAGNOSIS — Z0000 Encounter for general adult medical examination without abnormal findings: Secondary | ICD-10-CM

## 2017-03-29 LAB — CORTISOL: Cortisol, Plasma: 14.2 ug/dL

## 2017-03-29 LAB — COMPREHENSIVE METABOLIC PANEL
ALK PHOS: 119 U/L — AB (ref 39–117)
ALT: 38 U/L — ABNORMAL HIGH (ref 0–35)
AST: 21 U/L (ref 0–37)
Albumin: 3.9 g/dL (ref 3.5–5.2)
BUN: 17 mg/dL (ref 6–23)
CALCIUM: 9.4 mg/dL (ref 8.4–10.5)
CO2: 27 mEq/L (ref 19–32)
Chloride: 102 mEq/L (ref 96–112)
Creatinine, Ser: 0.97 mg/dL (ref 0.40–1.20)
GFR: 62.87 mL/min (ref >60.00)
Glucose, Bld: 127 mg/dL — ABNORMAL HIGH (ref 70–99)
POTASSIUM: 3.9 meq/L (ref 3.5–5.1)
Sodium: 141 mEq/L (ref 135–145)
Total Bilirubin: 0.4 mg/dL (ref 0.2–1.2)
Total Protein: 6.9 g/dL (ref 6.0–8.3)

## 2017-03-29 LAB — LIPID PANEL
CHOL/HDL RATIO: 5
CHOLESTEROL: 195 mg/dL (ref 0–200)
HDL: 38.3 mg/dL — ABNORMAL LOW (ref >39.00)
NONHDL: 156.86
Triglycerides: 213 mg/dL — ABNORMAL HIGH (ref 0.0–149.0)
VLDL: 42.6 mg/dL — AB (ref 0.0–40.0)

## 2017-03-29 LAB — CBC
HCT: 38.5 % (ref 36.0–46.0)
HEMOGLOBIN: 12.9 g/dL (ref 12.0–15.0)
MCHC: 33.6 g/dL (ref 30.0–36.0)
MCV: 89.7 fl (ref 78.0–100.0)
PLATELETS: 256 10*3/uL (ref 150.0–400.0)
RBC: 4.29 Mil/uL (ref 3.87–5.11)
RDW: 12.7 % (ref 11.5–15.5)
WBC: 8.3 10*3/uL (ref 4.0–10.5)

## 2017-03-29 LAB — LDL CHOLESTEROL, DIRECT: Direct LDL: 123 mg/dL

## 2017-03-29 LAB — TSH: TSH: 1.23 u[IU]/mL (ref 0.35–4.50)

## 2017-03-29 LAB — HEMOGLOBIN A1C: Hgb A1c MFr Bld: 6.2 % (ref 4.6–6.5)

## 2017-03-29 LAB — T4, FREE: Free T4: 1.16 ng/dL (ref 0.60–1.60)

## 2017-04-04 ENCOUNTER — Other Ambulatory Visit: Payer: Self-pay | Admitting: Internal Medicine

## 2017-04-04 ENCOUNTER — Other Ambulatory Visit: Payer: Self-pay | Admitting: Family

## 2017-04-06 ENCOUNTER — Encounter: Payer: Self-pay | Admitting: Internal Medicine

## 2017-04-14 ENCOUNTER — Encounter: Payer: Self-pay | Admitting: Internal Medicine

## 2017-04-14 ENCOUNTER — Other Ambulatory Visit: Payer: Self-pay | Admitting: Family

## 2017-04-14 MED ORDER — SYNTHROID 137 MCG PO TABS: 137.0000 ug | ORAL_TABLET | Freq: Every day | ORAL | 1 refills | Status: DC

## 2017-04-26 ENCOUNTER — Other Ambulatory Visit: Payer: Self-pay | Admitting: Internal Medicine

## 2017-04-27 ENCOUNTER — Other Ambulatory Visit: Payer: Self-pay | Admitting: Family

## 2017-04-27 ENCOUNTER — Other Ambulatory Visit: Payer: Self-pay | Admitting: Internal Medicine

## 2017-04-28 MED ORDER — METOPROLOL TARTRATE 50 MG PO TABS: 50.0000 mg | ORAL_TABLET | Freq: Two times a day (BID) | ORAL | 3 refills | Status: DC

## 2017-05-18 ENCOUNTER — Other Ambulatory Visit: Payer: Self-pay

## 2017-05-18 MED ORDER — DICLOFENAC SODIUM 2 % TD SOLN: 2.0000 "application " | Freq: Two times a day (BID) | TRANSDERMAL | 3 refills | Status: DC

## 2017-05-18 NOTE — Progress Notes (Signed)
Procedure Note   Patient was fitted for a : standard, cushioned, semi-rigid orthotic. The orthotic was heated and afterward the patient patient seated position and molded The patient was positioned in subtalar neutral position and 10 degrees of ankle dorsiflexion in a weight bearing stance. After completion of molding, patient did have orthotic management The blank was ground to a stable position for weight bearing. Size: 9 Base: Carbon fiber Additional Posting and Padding: heel lift noted. Increase 250/50 medially  The patient ambulated these, and they were very comfortable.

## 2017-05-19 ENCOUNTER — Encounter: Payer: Self-pay | Admitting: Family Medicine

## 2017-05-19 ENCOUNTER — Ambulatory Visit: Payer: PRIVATE HEALTH INSURANCE | Admitting: Family Medicine

## 2017-05-19 ENCOUNTER — Ambulatory Visit: Payer: Self-pay

## 2017-05-19 VITALS — BP 130/84 | HR 55 | Ht 67.0 in | Wt 221.0 lb

## 2017-05-19 DIAGNOSIS — M25571 Pain in right ankle and joints of right foot: Secondary | ICD-10-CM

## 2017-05-19 DIAGNOSIS — G5751 Tarsal tunnel syndrome, right lower limb: Secondary | ICD-10-CM

## 2017-05-19 DIAGNOSIS — G8929 Other chronic pain: Secondary | ICD-10-CM

## 2017-05-19 NOTE — Patient Instructions (Signed)
Good to see you  Kristen Rowe is your friend.  If a flare take the duexis 3 times a day for 3 days and then see me  Stay active overall.  Continue to avoid being barefoot.  Continue the vitamins Check in with me 1 more time in 6-8 weeks.  Happy holidays!

## 2017-05-19 NOTE — Assessment & Plan Note (Signed)
Patient placed in custom orthotics today.  We discussed continuing gabapentin if it seems to be doing better.  We discussed continuing the Cymbalta at the lower dose as well but patient has been not taking it.  Patient is no longer taking the nitroglycerin either.  Continue the home exercise.  Follow-up again in 4-6 weeks.

## 2017-05-25 ENCOUNTER — Encounter: Payer: Self-pay | Admitting: Family Medicine

## 2017-05-27 ENCOUNTER — Other Ambulatory Visit: Payer: Self-pay | Admitting: Family Medicine

## 2017-07-22 ENCOUNTER — Telehealth: Payer: Self-pay | Admitting: Family Medicine

## 2017-07-22 MED ORDER — IBUPROFEN-FAMOTIDINE 800-26.6 MG PO TABS: 1.0000 | ORAL_TABLET | Freq: Three times a day (TID) | ORAL | 0 refills | Status: DC | PRN

## 2017-07-22 NOTE — Telephone Encounter (Signed)
Patient informed of lab results. 

## 2017-07-22 NOTE — Telephone Encounter (Signed)
Kristen Rowe, Can Dr Sharlet Salina help with this? [facility] has left for the day.

## 2017-07-22 NOTE — Telephone Encounter (Signed)
Sent in [place] which is the sample she was given. This is usually expensive and not covered by insurance.

## 2017-07-22 NOTE — Telephone Encounter (Signed)
Copied from [id number]. Topic: Quick Communication - See Telephone Encounter >> Jul 22, 2017  2:32 PM Bea Graff, NT wrote: CRM for notification. See Telephone encounter for: Pt requesting a call from Dr. Darliss Ridgel nurse regarding the pain in her ankle. And to see if Dr. Tamala Julian wants to call in a rx for the samples they prescribed her. She can't remember the name of it but it ibuprofen with something in it for the stomach. Please contact pt.  07/22/17.

## 2017-07-23 ENCOUNTER — Other Ambulatory Visit: Payer: Self-pay | Admitting: Internal Medicine

## 2017-07-23 ENCOUNTER — Encounter: Payer: Self-pay | Admitting: Internal Medicine

## 2017-07-25 ENCOUNTER — Other Ambulatory Visit: Payer: Self-pay | Admitting: *Deleted

## 2017-07-25 MED ORDER — FAMOTIDINE 20 MG PO TABS: 20.0000 mg | ORAL_TABLET | Freq: Two times a day (BID) | ORAL | 0 refills | Status: DC

## 2017-07-25 MED ORDER — IBUPROFEN 800 MG PO TABS: 800.0000 mg | ORAL_TABLET | Freq: Three times a day (TID) | ORAL | 0 refills | Status: DC | PRN

## 2017-07-25 NOTE — Telephone Encounter (Signed)
Control database checked last refill: 02/25/2017 LOV: 03/25/2017

## 2017-07-27 ENCOUNTER — Other Ambulatory Visit: Payer: Self-pay | Admitting: *Deleted

## 2017-07-27 MED ORDER — IBUPROFEN-FAMOTIDINE 800-26.6 MG PO TABS: 1.0000 | ORAL_TABLET | Freq: Three times a day (TID) | ORAL | 0 refills | Status: DC | PRN

## 2017-08-10 ENCOUNTER — Ambulatory Visit: Payer: Self-pay

## 2017-08-10 NOTE — Telephone Encounter (Signed)
Patient called in saying " I was seen in Urgent Care in [place] on 08/02/17 for severe bronchitis. I was prescribed an antibiotic, prednisone, tessalon pearls, albuterol inhaler and nebulizer. In the office I was given a steroid shot and was told by the provider that I may need to get another one if the wheezing persists. I still have a cough, some wheezing and SOB. I just wanted to call and see if I needed to come in before the weekend to get a steroid shot. I am still taking all of the medicines, but is tapering off the prednisone. Then, I woke up yesterday with this right ear ache. It was like something crawled up in my ear and stopped it up." I advised I would triage her for the acute problem she's having now, which is the ear pain, she verbalized understanding. I asked her how severe is the pain, she said "5-6 and it is sore behind the ear." I asked is it draining, she said "no." The only symptom she's having is runny nose. Denies neck stiffness, headache, dizziness, vomiting, decreased hearing. According to protocol, see PCP within 24 hours, appointment made for tomorrow at [facility] with Dr. Sharlet Salina, care advice given, patient verbalized understanding.   Reason for Disposition . Earache  (Exceptions: brief ear pain of < 60 minutes duration, earache occurring during air travel  Answer Assessment - Initial Assessment Questions 1. LOCATION: "Which ear is involved?"     Right ear 2. ONSET: "When did the ear start hurting"      Yesterday 3. SEVERITY: "How bad is the pain?"  (Scale 1-10; mild, moderate or severe)   - MILD (1-3): doesn't interfere with normal activities    - MODERATE (4-7): interferes with normal activities or awakens from sleep    - SEVERE (8-10): excruciating pain, unable to do any normal activities      5-6 4. URI SYMPTOMS: " Do you have a runny nose or cough?"     Runny nose, cough, bronchitis 5. FEVER: "Do you have a fever?" If so, ask: "What is your temperature, how was it measured,  and when did it start?"     No 6. CAUSE: "Have you been swimming recently?", "How often do you use Q-TIPS?", "Have you had any recent air travel or scuba diving?"     No 7. OTHER SYMPTOMS: "Do you have any other symptoms?" (e.g., headache, stiff neck, dizziness, vomiting, runny nose, decreased hearing)     Nothing except runny 8. PREGNANCY: "Is there any chance you are pregnant?" "When was your last menstrual period?"     No  Protocols used: EARACHE-A-AH

## 2017-08-11 ENCOUNTER — Encounter: Payer: Self-pay | Admitting: Internal Medicine

## 2017-08-11 ENCOUNTER — Ambulatory Visit: Payer: PRIVATE HEALTH INSURANCE | Admitting: Internal Medicine

## 2017-08-11 DIAGNOSIS — H9201 Otalgia, right ear: Secondary | ICD-10-CM | POA: Diagnosis not present

## 2017-08-11 DIAGNOSIS — R0602 Shortness of breath: Secondary | ICD-10-CM | POA: Diagnosis not present

## 2017-08-11 DIAGNOSIS — R05 Cough: Secondary | ICD-10-CM

## 2017-08-11 DIAGNOSIS — R059 Cough, unspecified: Secondary | ICD-10-CM | POA: Insufficient documentation

## 2017-08-11 MED ORDER — AZITHROMYCIN 250 MG PO TABS
ORAL_TABLET | ORAL | 0 refills | Status: DC
Start: 2017-08-11 — End: 2017-12-08

## 2017-08-11 NOTE — Assessment & Plan Note (Signed)
Suspect from allergies and drainage. No antibiotics are indicated. Can finish steroid dosing. Advised to double claritin and try flonase but she declines this today.

## 2017-08-11 NOTE — Assessment & Plan Note (Signed)
Suspect pertussis, rx for azithromycin and advised precautions. Not around any small children. She does not have recent tetanus booster. Advised that cough could last for several months even with treatment.

## 2017-08-11 NOTE — Patient Instructions (Signed)
Stop taking the antibiotic from before.   Keep taking the prednisone until gone.  We have sent in the z-pack to take 2 pills today, then 1 pill daily until gone.  This could be pertussis so the cough could linger 4-6 weeks. If you start feeling worse, more breathing problems call us back.  You do not need to take the albuterol inhaler or nebulizer. It might help to use the inhaler before exercise to help open you up more.   Pertussis, Adult Pertussis, also called whooping cough, is an infection that causes severe and sudden coughing attacks. Pertussis spreads easily from person to person (is contagious). It spreads through the droplets that are sprayed in the air when an infected person talks, coughs, or sneezes. Symptoms of pertussis can last for up to 6 weeks, even though the cough starts to get better. It may take as long as 6 months for the cough to go away completely. What are the causes? This condition is caused by bacteria. The bacteria can spread to someone when he or she:  Inhales droplets that have been sprayed in the air by an infected person.  Touches a surface where the droplets fell and then touches his or her mouth or nose.  What are the signs or symptoms? Symptoms of this condition include cold-like symptoms, such as:  A runny nose.  Low fever.  Mild cough.  Red, watery eyes.  These symptoms develop at the beginning of the infection. After 1-2 weeks, the cold symptoms get better, but the cough worsens, and severe and sudden coughing attacks frequently develop. During these attacks, people may cough so hard that vomiting occurs. How is this diagnosed? This condition may be diagnosed by:  A physical exam.  Lab tests of mucus from the nose and throat.  A blood test.  A chest X-ray.  How is this treated? This condition is treated with antibiotic medicines.  Starting antibiotics quickly may help shorten the illness and make it less contagious.  Antibiotics  may also be prescribed for everyone who lives in the same household.   Immunization may be recommended for people in the household who are at risk of developing pertussis. At-risk groups include: ? Infants. ? Those who have not had their full course of pertussis immunizations. ? Those who were immunized but have not had their recent booster shot. Mild coughing may continue for months after the infection is treated. This coughing may be due to the remaining soreness and inflammation in the lungs. Follow these instructions at home: Medicines   Take over-the-counter and prescription medicines only as told by your health care provider.  Take your antibiotic medicine as told by your health care provider. Do not stop taking the antibiotic even if you start to feel better.  Do not take cough medicine unless told by your health care provider. Coughing attack  If you are having a coughing attack: ? Raise (elevate) the head of your mattress or raise your head with pillows to improve breathing. This will also make it easier to clear out mucus that is brought up from the lungs to the throat during a cough (sputum). ? Sit upright.  Avoid substances that may irritate the lungs, such as smoke, aerosols, or fumes. These substances may make your coughing worse.  Use a cool mist humidifier at home to increase air moisture. This will soothe your cough and help to loosen sputum. Do not use hot steam. Prevent infection  For the first 5 days of  antibiotic treatment, stay away from those who are at risk of developing pertussis. If no antibiotics are prescribed, stay at home for the first 3 weeks that you are coughing or as told by your health care provider.  Do not go to work until you have been treated with antibiotics for 5 days. If no antibiotics are prescribed, do not go to work for the first 3 weeks that you are coughing or as told by your health care provider. Tell your workplace that you were diagnosed  with pertussis.  Wash your hands often with soap and water. Everyone in your household should also wash hands often to avoid spreading the infection. If soap and water are not available, use hand sanitizer. General instructions  Rest as much as possible. Slowly go back to your normal activities as told by your health care provider.  Drink enough fluid to keep your urine clear or pale yellow.  Keep all follow-up visits as told by your health care provider. This is important. How is this prevented?  Pertussis can be prevented with a vaccine and later booster shots.  The pertussis vaccine is given during childhood.  If you are an adult who was never vaccinated, get vaccinated as soon as possible.  If you are an adult who was previously vaccinated, talk with your health care providers about the need for a booster shot because immunity from the vaccine decreases over time.  All of the following people should consider receiving a booster dose of pertussis: ? Pregnant women. ? Everyone who has or will have close contact with an infant who is less than 37 months old. ? All health care personnel. Contact a health care provider if:  You cannot stop vomiting.  You are not able to eat or drink.  Your cough does not improve.  You have a fever. Get help right away if:  Your face turns red or blue during a coughing attack.  You pass out after a coughing attack, even if only for a few moments.  Your breathing stops for a period of time (apnea).  You are restless or cannot sleep.  You feel sluggish or you are sleeping too much. This information is not intended to replace advice given to you by your health care provider. Make sure you discuss any questions you have with your health care provider. Document Released: 09/25/2012 Document Revised: 12/20/2015 Document Reviewed: 11/17/2015 Elsevier Interactive Patient Education  [employer].

## 2017-08-11 NOTE — Assessment & Plan Note (Signed)
Does not have wheezing so scheduled nebulizers and inhaler are not necessary. CXR report reviewed and no pneumonia. Suspect pertussis based on symptoms. Given rx for azithromycin and asked to stop omnicef as this does not help treat. Advised albuterol inhaler prior to exercise to help endurance. Can finish prednisone. Increase exercise gradually and we talked about risk of contagion with coughing on others.

## 2017-08-11 NOTE — Progress Notes (Signed)
   Subjective:    Patient ID: Kristen Rowe, female    DOB: [birth year], 57 y.o.   MRN: [id number]  HPI The patient is a 57 YO female coming in for several concerns including cough (going on for about 2 weeks now, seen in [place] where she was traveling and treated with steroids, omnicef 2 week course, CXR done which was negative, and albuterol nebulizer and inhalers scheduled, she has taken the steroids and antibiotics and has 2 days left of both, using albuterol nebulizer scheduled BID and inhaler TID scheduled without noticing any benefit, she did not notice any benefit from that ever, some wheezing is better since taking the medications, still coughing a lot but gradually improving, fevers initially but not in the last 4 days, she is coughing so much that she vomits and although this is mildly better now it is still happening), and her ear ache (right ear, going on 2-3 days now, taking claritin daily, having some drainage and the cough still, denies taking anything for this, is having some change in the hearing quality but still able to hear normally, overall worsening), and SOB (she is having most SOB with coughing fits, initially was wheezing some but not anymore, is able to do moderate activity without SOB, using albuterol does not seem to help, is very tired and tires easily).   Review of Systems  Constitutional: Positive for activity change, appetite change and fatigue. Negative for chills, fever and unexpected weight change.  HENT: Positive for congestion, ear pain, hearing loss, postnasal drip, rhinorrhea and sinus pressure. Negative for ear discharge, sinus pain, sneezing, sore throat, tinnitus, trouble swallowing and voice change.   Eyes: Negative.   Respiratory: Positive for cough, shortness of breath and wheezing. Negative for chest tightness.   Cardiovascular: Negative.   Gastrointestinal: Negative.   Musculoskeletal: Positive for myalgias.  Neurological: Negative.       Objective:   Physical Exam  Constitutional: She is oriented to person, place, and time. She appears well-developed and well-nourished.  HENT:  Head: Normocephalic and atraumatic.  Oropharynx with redness and clear drainage, nose with swollen turbinates, TMs normal bilaterally  Eyes: EOM are normal.  Neck: Normal range of motion. No thyromegaly present.  Cardiovascular: Normal rate and regular rhythm.  Pulmonary/Chest: Effort normal and breath sounds normal. No respiratory distress. She has no wheezes. She has no rales. She exhibits tenderness.  Cough sounds to whoop, lungs clear between coughing fits, upper airway sounds with the coughing  Abdominal: Soft.  Musculoskeletal: She exhibits tenderness.  Lymphadenopathy:    She has no cervical adenopathy.  Neurological: She is alert and oriented to person, place, and time.  Skin: Skin is warm and dry.   Vitals:   08/11/17 0909  BP: 110/70  Pulse: 61  Temp: 97.9 F (36.6 C)  TempSrc: Oral  SpO2: 98%  Weight: 220 lb (99.8 kg)  Height: 5\' 7"  (1.702 m)      Assessment & Plan:

## 2017-08-18 ENCOUNTER — Other Ambulatory Visit: Payer: Self-pay | Admitting: Family

## 2017-08-18 MED ORDER — CITALOPRAM HYDROBROMIDE 20 MG PO TABS: 20.0000 mg | ORAL_TABLET | Freq: Every day | ORAL | 0 refills | Status: DC

## 2017-08-18 NOTE — Telephone Encounter (Signed)
Pt called back, Pt was unhappy with the Duloxetine,  When she was prescribed the Duloxetine she took it for a month, did not like it and continued to take her Celexa, she has since been taking it and would like to continue taking it and would like refills of this.  Please advise

## 2017-08-18 NOTE — Telephone Encounter (Addendum)
Patient checking status, would like to know denial reason. Call back # [phone]. Patient seen Dr Sharlet Salina on 08/11/17

## 2017-08-18 NOTE — Telephone Encounter (Signed)
Sent in 1 month and needs follow up within the month to discuss. In the future she needs to update her medications so they are accurate at all visits as we saw her less than 2 weeks ago and she did not inform us she was taking.

## 2017-08-18 NOTE — Telephone Encounter (Signed)
LVM for patient to call back and discuss, on 03/09/2017 Kristen Rowe took her off of this medication and changed it to Duloxetine, this is for treating the same problem(depression) just a different med.  Is patient unhappy with Duloxetine?  Has patient been taking both?

## 2017-08-18 NOTE — Addendum Note (Signed)
Addended by: Pricilla Holm A on: 08/18/2017 04:16 PM   Modules accepted: Orders

## 2017-08-18 NOTE — Telephone Encounter (Signed)
Looks like refill request went to [facility]

## 2017-10-04 ENCOUNTER — Other Ambulatory Visit: Payer: Self-pay | Admitting: Internal Medicine

## 2017-10-07 ENCOUNTER — Other Ambulatory Visit: Payer: Self-pay | Admitting: Internal Medicine

## 2017-11-26 ENCOUNTER — Other Ambulatory Visit: Payer: Self-pay | Admitting: Internal Medicine

## 2017-12-08 ENCOUNTER — Ambulatory Visit: Payer: 59 | Admitting: Family Medicine

## 2017-12-08 ENCOUNTER — Encounter: Payer: Self-pay | Admitting: Family Medicine

## 2017-12-08 ENCOUNTER — Other Ambulatory Visit: Payer: Self-pay | Admitting: Internal Medicine

## 2017-12-08 ENCOUNTER — Telehealth: Payer: Self-pay | Admitting: Internal Medicine

## 2017-12-08 VITALS — BP 138/74 | HR 78 | Ht 67.0 in | Wt 201.0 lb

## 2017-12-08 DIAGNOSIS — M7661 Achilles tendinitis, right leg: Secondary | ICD-10-CM | POA: Diagnosis not present

## 2017-12-08 MED ORDER — MELOXICAM 15 MG PO TABS: 15.0000 mg | ORAL_TABLET | Freq: Every day | ORAL | 0 refills | Status: DC

## 2017-12-08 NOTE — Telephone Encounter (Signed)
Control database checked last refill: 10/27/2017   

## 2017-12-08 NOTE — Telephone Encounter (Signed)
Copied from [place] [id number]. Topic: Appointment Scheduling - Scheduling Inquiry for Clinic >> Dec 08, 2017 10:28 AM Kristen Rowe wrote: Reason for CRM: pt called in and is requesting to get in with DR Tamala Julian or if Dr Tamala Julian could advise her.  Offered her other options to get see Paulla Fore and Raeford Razor.  Wanted me to sent message to smith 1st too see what he sugguest, she stepped off the step on the porch and hear her Achilles Tendon pop. She stated she is in Rowe lot of pain.  Please advise   Best number - [phone]

## 2017-12-08 NOTE — Progress Notes (Signed)
Kristen Rowe - 57 y.o. female MRN [id number]  Date of birth: 16-Jul-1960  SUBJECTIVE:  Including CC & ROS.  Chief Complaint  Patient presents with  . Right ankle pain    Kristen Rowe is a 57 y.o. female that is presenting with right ankle pain. She stepped outside on her stairs and felt a stabbing pain.Denies falling or twisting her ankle. Pain is located on her achilles. Admits to swelling and tenderness. She has been wearing a brace and orthotic. Denies injury. She has been taking Motin for the pain. Denies tingling or numbness. Pain is constant. Difficulty walking. Worse with dorsiflexion and plantar flexion. She has a history of similar problem but seems to have been exacerbated.    Review of Systems  Constitutional: Negative for fever.  HENT: Negative for congestion.   Respiratory: Negative for cough.   Cardiovascular: Negative for chest pain.  Gastrointestinal: Negative for abdominal pain.  Musculoskeletal: Positive for gait problem.  Skin: Negative for color change.  Neurological: Negative for weakness.  Hematological: Negative for adenopathy.  Psychiatric/Behavioral: Negative for agitation.    HISTORY: Past Medical, Surgical, Social, and Family History Reviewed & Updated per EMR.   Pertinent Historical Findings include:  Past Medical History:  Diagnosis Date  . Thyroid disease     Past Surgical History:  Procedure Laterality Date  . APPENDECTOMY    . CHOLECYSTECTOMY      Allergies  Allergen Reactions  . Codeine Itching    Family History  Problem Relation Age of Onset  . Cancer Mother        lung  . Heart disease Mother   . Hypertension Mother   . Heart disease Father   . Hypertension Father   . Hypertension Sister   . Hypertension Brother      Social History   Socioeconomic History  . Marital status: Single    Spouse name: Not on file  . Number of children: Not on file  . Years of education: Not on file  . Highest education level: Not on file    Occupational History  . Not on file  Social Needs  . Financial resource strain: Not on file  . Food insecurity:    Worry: Not on file    Inability: Not on file  . Transportation needs:    Medical: Not on file    Non-medical: Not on file  Tobacco Use  . Smoking status: Former Smoker    Last attempt to quit: 04/26/2016    Years since quitting: 1.6  . Smokeless tobacco: Never Used  Substance and Sexual Activity  . Alcohol use: No  . Drug use: No  . Sexual activity: Not on file  Lifestyle  . Physical activity:    Days per week: Not on file    Minutes per session: Not on file  . Stress: Not on file  Relationships  . Social connections:    Talks on phone: Not on file    Gets together: Not on file    Attends religious service: Not on file    Active member of club or organization: Not on file    Attends meetings of clubs or organizations: Not on file    Relationship status: Not on file  . Intimate partner violence:    Fear of current or ex partner: Not on file    Emotionally abused: Not on file    Physically abused: Not on file    Forced sexual activity: Not on file  Other Topics Concern  .  Not on file  Social History Narrative  . Not on file     PHYSICAL EXAM:  VS: BP 138/74 (BP Location: Left Arm, Patient Position: Sitting, Cuff Size: Normal)   Pulse 78   Ht 5\' 7"  (1.702 m)   Wt 201 lb (91.2 kg)   SpO2 97%   BMI 31.48 kg/m  Physical Exam Gen: NAD, alert, cooperative with exam, well-appearing ENT: normal lips, normal nasal mucosa,  Eye: normal EOM, normal conjunctiva and lids CV:  no edema, +2 pedal pulses   Resp: no accessory muscle use, non-labored,  Skin: no rashes, no areas of induration  Neuro: normal tone, normal sensation to touch Psych:  normal insight, alert and oriented MSK:  Right Ankle:  Swelling of the Achilles present. Some weakness with plantarflexion.  And some pain with plantarflexion. Plantarflexion with [place] Silos test. No redness or  ecchymosis. Neurovascular intact  Limited ultrasound: Right Achilles:  Thickened Achilles with hypoechoic changes to suggest chronic tendinopathy.  Does not appear to have any tear.  Summary: Achilles tendinopathy  Ultrasound and interpretation by Clearance Coots, MD              ASSESSMENT & PLAN:   Achilles tendinitis, right leg Seems he was having acute exacerbation of her ongoing tendinopathy.  No tear appreciated but does have swelling. -Advised to use nitro as indicated. -Can use a cam walker in the acute phase if needed. -Provided Mobic -Can follow-up in 2 to 3 weeks to monitor.  Can consider providing exercises at that time.

## 2017-12-08 NOTE — Patient Instructions (Addendum)
Nice to meet you  Please try the boot if it helps your pain  Please try the mobic for 7 days and then as needed Please try compression on your achilles.  Please try the heel lifts  Please follow up in 2 weeks.   Nitroglycerin Protocol   Apply 1/4 nitroglycerin patch to affected area daily.  Change position of patch within the affected area every 24 hours.  You may experience a headache during the first 1-2 weeks of using the patch, these should subside.  If you experience headaches after beginning nitroglycerin patch treatment, you may take your preferred over the counter pain reliever.  Another side effect of the nitroglycerin patch is skin irritation or rash related to patch adhesive.  Please notify our office if you develop more severe headaches or rash, and stop the patch.  Tendon healing with nitroglycerin patch may require 12 to 24 weeks depending on the extent of injury.  Men should not use if taking Viagra, Cialis, or Levitra.   Do not use if you have migraines or rosacea.

## 2017-12-08 NOTE — Telephone Encounter (Signed)
Spoke to pt, scheduled her to see Dr. Raeford Razor today at 220p.

## 2017-12-09 NOTE — Assessment & Plan Note (Signed)
Seems he was having acute exacerbation of her ongoing tendinopathy.  No tear appreciated but does have swelling. -Advised to use nitro as indicated. -Can use a cam walker in the acute phase if needed. -Provided Mobic -Can follow-up in 2 to 3 weeks to monitor.  Can consider providing exercises at that time.

## 2017-12-23 ENCOUNTER — Ambulatory Visit: Payer: 59 | Admitting: Family Medicine

## 2018-02-10 ENCOUNTER — Ambulatory Visit: Payer: Self-pay | Admitting: Internal Medicine

## 2018-02-10 ENCOUNTER — Encounter: Payer: Self-pay | Admitting: Internal Medicine

## 2018-02-10 ENCOUNTER — Ambulatory Visit: Payer: 59 | Admitting: Internal Medicine

## 2018-02-10 ENCOUNTER — Ambulatory Visit (INDEPENDENT_AMBULATORY_CARE_PROVIDER_SITE_OTHER)
Admission: RE | Admit: 2018-02-10 | Discharge: 2018-02-10 | Disposition: A | Discharge status: Home / Self Care | Payer: 59 | Source: Ambulatory Visit | Attending: Internal Medicine | Admitting: Internal Medicine

## 2018-02-10 VITALS — BP 138/80 | HR 66 | Temp 98.3°F | Ht 67.0 in

## 2018-02-10 DIAGNOSIS — R0602 Shortness of breath: Secondary | ICD-10-CM

## 2018-02-10 DIAGNOSIS — R05 Cough: Secondary | ICD-10-CM | POA: Diagnosis not present

## 2018-02-10 DIAGNOSIS — R059 Cough, unspecified: Secondary | ICD-10-CM

## 2018-02-10 HISTORY — PX: ANKLE SURGERY: SHX546

## 2018-02-10 MED ORDER — BENZONATATE 200 MG PO CAPS: 200.0000 mg | ORAL_CAPSULE | Freq: Three times a day (TID) | ORAL | 0 refills | Status: DC | PRN

## 2018-02-10 MED ORDER — ALBUTEROL SULFATE (2.5 MG/3ML) 0.083% IN NEBU: 2.5000 mg | INHALATION_SOLUTION | Freq: Four times a day (QID) | RESPIRATORY_TRACT | 1 refills | Status: DC | PRN

## 2018-02-10 MED ORDER — ALBUTEROL SULFATE (2.5 MG/3ML) 0.083% IN NEBU
2.5000 mg | INHALATION_SOLUTION | Freq: Once | RESPIRATORY_TRACT | Status: AC
  Administered 2018-02-10: 2.5 mg via RESPIRATORY_TRACT

## 2018-02-10 MED ORDER — IPRATROPIUM-ALBUTEROL 0.5-2.5 (3) MG/3ML IN SOLN
3.0000 mL | Freq: Once | RESPIRATORY_TRACT | Status: AC
  Administered 2018-02-10: 3 mL via RESPIRATORY_TRACT

## 2018-02-10 NOTE — Progress Notes (Signed)
   Subjective:    Patient ID: Kristen Rowe, female    DOB: 22-Oct-1960, 58 y.o.   MRN: [id number]  HPI The patient is a 57 YO female coming in for cough. Had a recent surgery on 02/08/18  For achilles tendon rupture and tendon grafting and woke up after the surgery with a cough and feeling like she cannot get a deep breath. She told them after the surgery and they just gave her a breathing treatment which helped temporarily. She is coughing mostly non-productive. She feels like she cannot get a deep breath. Sometimes waking up a night time coughing and feeling like she cannot breath and sits up in bed which helps. She denies fevers or chills. Has had pneumonia in the last year. Denies chest pains. Denies abdominal symptoms such as nausea or vomiting.   PMH, [facility], social history reviewed and updated.   Review of Systems  Constitutional: Negative.   HENT: Negative.   Eyes: Negative.   Respiratory: Positive for cough and shortness of breath. Negative for chest tightness.   Cardiovascular: Negative for chest pain, palpitations and leg swelling.  Gastrointestinal: Negative for abdominal distention, abdominal pain, constipation, diarrhea, nausea and vomiting.  Musculoskeletal: Positive for arthralgias and gait problem.  Skin: Negative.   Neurological: Negative for dizziness, speech difficulty, weakness and headaches.  Psychiatric/Behavioral: Negative.       Objective:   Physical Exam  Constitutional: She is oriented to person, place, and time. She appears well-developed and well-nourished.  HENT:  Head: Normocephalic and atraumatic.  Eyes: EOM are normal.  Neck: Normal range of motion.  Cardiovascular: Normal rate and regular rhythm.  Pulmonary/Chest: Effort normal and breath sounds normal. No respiratory distress. She has no wheezes. She has no rales.  Deep non-productive cough during deep breathing, lung exam normal prior to and post albuterol treatment  Abdominal: Soft. Bowel sounds are  normal. She exhibits no distension. There is no tenderness. There is no rebound.  Musculoskeletal: She exhibits no edema.  Neurological: She is alert and oriented to person, place, and time. Coordination normal.  Skin: Skin is warm and dry.  Psychiatric: She has a normal mood and affect.   Vitals:   02/10/18 1321  BP: 138/80  Pulse: 66  Temp: 98.3 F (36.8 C)  TempSrc: Oral  SpO2: 98%  Height: 5\' 7"  (1.702 m)   Albuterol treatment given at visit.     Assessment & Plan:

## 2018-02-10 NOTE — Assessment & Plan Note (Signed)
Suspect some atelectasis post operatively given 2 hours of general anaesthesia. We talked about deep breathing techniques to help open small airways. Rx for tessalon perles.

## 2018-02-10 NOTE — Assessment & Plan Note (Signed)
Suspicious for airway irritation post operatively. Given albuterol nebulizer in the office which did not provide change in lung exam or improvement in symptoms. She has nebulizer at home and albuterol rx sent in. Rx for tessalon perles for cough and checking CXR today.

## 2018-02-10 NOTE — Addendum Note (Signed)
Addended by: Raford Pitcher R on: 02/10/2018 04:33 PM   Modules accepted: Orders

## 2018-02-10 NOTE — Patient Instructions (Signed)
We are checking the x-ray today and have sent in the tessalon perles and the albuterol nebulizer medicine.

## 2018-02-10 NOTE — Telephone Encounter (Signed)
Spoke with patient Pt has severe dry cough since surgery 02/08/18. Pt stated taking deep breath was difficult but was able to have conversations without difficulty. She had spoken to her surgeon who recommended going to ED. Pt refused and insisted on PCP visit.  I spoke with F/C Britney at [facility] office. Pt Scheduled to see Dr Sharlet Salina today. Care advice given  Pt verbalized understanding of all instructions Reason for Disposition . Patient sounds very sick or weak to the triager  Answer Assessment - Initial Assessment Questions 1. ONSET: "When did the cough begin?"      02/08/18 after surgery 2. SEVERITY: "How bad is the cough today?"      Very bad 3. RESPIRATORY DISTRESS: "Describe your breathing."      Tight cough can't get breath 4. FEVER: "Do you have a fever?" If so, ask: "What is your temperature, how was it measured, and when did it start?"     No  5. HEMOPTYSIS: "Are you coughing up any blood?" If so ask: "How much?" (flecks, streaks, tablespoons, etc.)     no 6. TREATMENT: "What have you done so far to treat the cough?" (e.g., meds, fluids, humidifier)     Cough tessolon 7. CARDIAC HISTORY: "Do you have any history of heart disease?" (e.g., heart attack, congestive heart failure)      no 8. LUNG HISTORY: "Do you have any history of lung disease?"  (e.g., pulmonary embolus, asthma, emphysema)     no 9. PE RISK FACTORS: "Do you have a history of blood clots?" (or: recent major surgery, recent prolonged travel, bedridden)     Recent surgery 10. OTHER SYMPTOMS: "Do you have any other symptoms? (e.g., runny nose, wheezing, chest pain)       none  12. TRAVEL: "Have you traveled out of the country in the last month?" (e.g., travel history, exposures)       no  Protocols used: COUGH - ACUTE NON-PRODUCTIVE-A-AH

## 2018-02-10 NOTE — Telephone Encounter (Signed)
Noted  

## 2018-04-11 ENCOUNTER — Other Ambulatory Visit: Payer: Self-pay | Admitting: Internal Medicine

## 2018-04-11 ENCOUNTER — Other Ambulatory Visit: Payer: Self-pay | Admitting: Family

## 2018-04-11 NOTE — Telephone Encounter (Signed)
Control database checked last refill:03/06/2018 LOV: acute 02/10/2018 NOV:04/18/2018

## 2018-04-18 ENCOUNTER — Ambulatory Visit: Payer: 59 | Admitting: Internal Medicine

## 2018-04-18 ENCOUNTER — Other Ambulatory Visit: Payer: Self-pay | Admitting: Family

## 2018-04-18 ENCOUNTER — Encounter: Payer: Self-pay | Admitting: Internal Medicine

## 2018-04-18 ENCOUNTER — Other Ambulatory Visit (INDEPENDENT_AMBULATORY_CARE_PROVIDER_SITE_OTHER): Payer: 59

## 2018-04-18 VITALS — BP 128/82 | HR 64 | Temp 97.5°F | Ht 67.0 in | Wt 239.0 lb

## 2018-04-18 DIAGNOSIS — Z8249 Family history of ischemic heart disease and other diseases of the circulatory system: Secondary | ICD-10-CM | POA: Diagnosis not present

## 2018-04-18 DIAGNOSIS — Z0000 Encounter for general adult medical examination without abnormal findings: Secondary | ICD-10-CM

## 2018-04-18 DIAGNOSIS — R059 Cough, unspecified: Secondary | ICD-10-CM

## 2018-04-18 DIAGNOSIS — E039 Hypothyroidism, unspecified: Secondary | ICD-10-CM

## 2018-04-18 DIAGNOSIS — R05 Cough: Secondary | ICD-10-CM

## 2018-04-18 DIAGNOSIS — F325 Major depressive disorder, single episode, in full remission: Secondary | ICD-10-CM

## 2018-04-18 DIAGNOSIS — Z1211 Encounter for screening for malignant neoplasm of colon: Secondary | ICD-10-CM | POA: Diagnosis not present

## 2018-04-18 DIAGNOSIS — R7301 Impaired fasting glucose: Secondary | ICD-10-CM

## 2018-04-18 LAB — COMPREHENSIVE METABOLIC PANEL
ALBUMIN: 3.9 g/dL (ref 3.5–5.2)
ALT: 57 U/L — ABNORMAL HIGH (ref 0–35)
AST: 27 U/L (ref 0–37)
Alkaline Phosphatase: 160 U/L — ABNORMAL HIGH (ref 39–117)
BUN: 13 mg/dL (ref 6–23)
CO2: 29 mEq/L (ref 19–32)
Calcium: 9.2 mg/dL (ref 8.4–10.5)
Chloride: 98 mEq/L (ref 96–112)
Creatinine, Ser: 1.02 mg/dL (ref 0.40–1.20)
GFR: 59.11 mL/min — ABNORMAL LOW (ref >60.00)
Glucose, Bld: 98 mg/dL (ref 70–99)
Potassium: 3.8 mEq/L (ref 3.5–5.1)
SODIUM: 135 meq/L (ref 135–145)
Total Bilirubin: 0.3 mg/dL (ref 0.2–1.2)
Total Protein: 7.1 g/dL (ref 6.0–8.3)

## 2018-04-18 LAB — CBC
HEMATOCRIT: 35.3 % — AB (ref 36.0–46.0)
Hemoglobin: 12.1 g/dL (ref 12.0–15.0)
MCHC: 34.3 g/dL (ref 30.0–36.0)
MCV: 90.8 fl (ref 78.0–100.0)
Platelets: 278 10*3/uL (ref 150.0–400.0)
RBC: 3.89 Mil/uL (ref 3.87–5.11)
RDW: 13.2 % (ref 11.5–15.5)
WBC: 10.9 10*3/uL — AB (ref 4.0–10.5)

## 2018-04-18 LAB — VITAMIN B12: Vitamin B-12: 1193 pg/mL — ABNORMAL HIGH (ref 211–911)

## 2018-04-18 LAB — T4, FREE: Free T4: 0.84 ng/dL (ref 0.60–1.60)

## 2018-04-18 LAB — LIPID PANEL
CHOL/HDL RATIO: 7
Cholesterol: 287 mg/dL — ABNORMAL HIGH (ref 0–200)
HDL: 43.3 mg/dL (ref >39.00)
NonHDL: 244.04
Triglycerides: 397 mg/dL — ABNORMAL HIGH (ref 0.0–149.0)
VLDL: 79.4 mg/dL — AB (ref 0.0–40.0)

## 2018-04-18 LAB — TSH: TSH: 23.21 u[IU]/mL — AB (ref 0.35–4.50)

## 2018-04-18 LAB — HEMOGLOBIN A1C: HEMOGLOBIN A1C: 6.1 % (ref 4.6–6.5)

## 2018-04-18 LAB — LDL CHOLESTEROL, DIRECT: Direct LDL: 175 mg/dL

## 2018-04-18 LAB — VITAMIN D 25 HYDROXY (VIT D DEFICIENCY, FRACTURES): VITD: 28.87 ng/mL — AB (ref 30.00–100.00)

## 2018-04-18 MED ORDER — METOPROLOL TARTRATE 50 MG PO TABS: 50.0000 mg | ORAL_TABLET | Freq: Two times a day (BID) | ORAL | 3 refills | Status: DC

## 2018-04-18 MED ORDER — DOXYCYCLINE HYCLATE 100 MG PO TABS: 100.0000 mg | ORAL_TABLET | Freq: Two times a day (BID) | ORAL | 0 refills | Status: DC

## 2018-04-18 NOTE — Progress Notes (Signed)
   Subjective:    Patient ID: Kristen Rowe, female    DOB: 09-15-1960, 57 y.o.   MRN: [id number]  HPI The patient is a 57 YO female coming in for physical.  PMH, [facility], social history reviewed and updated.   Review of Systems  Constitutional: Negative.   Eyes: Negative.   Respiratory: Positive for cough and wheezing. Negative for chest tightness, shortness of breath and stridor.   Cardiovascular: Negative for chest pain, palpitations and leg swelling.  Gastrointestinal: Negative for abdominal distention, abdominal pain, constipation, diarrhea, nausea and vomiting.  Musculoskeletal: Negative.   Skin: Negative.   Neurological: Negative.   Psychiatric/Behavioral: Negative.       Objective:   Physical Exam  Constitutional: She is oriented to person, place, and time. She appears well-developed and well-nourished.  HENT:  Head: Normocephalic and atraumatic.  Eyes: EOM are normal.  Neck: Normal range of motion.  Cardiovascular: Normal rate and regular rhythm.  Pulmonary/Chest: Effort normal. No respiratory distress. She has no wheezes. She has no rales.  Rhonchi on the right and left which partially clear with coughing  Abdominal: Soft. Bowel sounds are normal. She exhibits no distension. There is no tenderness. There is no rebound.  Musculoskeletal: She exhibits no edema.  Neurological: She is alert and oriented to person, place, and time. Coordination normal.  Skin: Skin is warm and dry.  Psychiatric: She has a normal mood and affect.   Vitals:   04/18/18 1538  BP: 128/82  Pulse: 64  Temp: (!) 97.5 F (36.4 C)  TempSrc: Oral  SpO2: 99%  Weight: 239 lb (108.4 kg)  Height: 5\' 7"  (1.702 m)      Assessment & Plan:

## 2018-04-18 NOTE — Patient Instructions (Signed)

## 2018-04-19 ENCOUNTER — Telehealth: Payer: Self-pay | Admitting: Internal Medicine

## 2018-04-19 NOTE — Assessment & Plan Note (Signed)
Rx for doxycycline today for mild rhonchi on exam.

## 2018-04-19 NOTE — Assessment & Plan Note (Signed)
Checking TSH and free T4 and adjust as needed.  

## 2018-04-19 NOTE — Assessment & Plan Note (Addendum)
Flu shot up to date. Shingrix counseled. Tetanus up to date. Cologuard ordered. Mammogram needs done, pap smear needs done. Counseled about sun safety and mole surveillance. Counseled about the dangers of distracted driving. Given 10 year screening recommendations.

## 2018-04-19 NOTE — Assessment & Plan Note (Signed)
Checking HgA1c and adjust as needed.  

## 2018-04-19 NOTE — Telephone Encounter (Signed)
[facility] called regarding the cardiology referral, it was put in for routine medical exam and there are no notes the patient has heart problems, please advise on referral

## 2018-04-19 NOTE — Assessment & Plan Note (Signed)
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 No psychotic features. Taking wellbutrin which is doing well and she wishes to continue.

## 2018-04-20 NOTE — Telephone Encounter (Signed)
Changed diagnosis on referral and called heart care

## 2018-04-20 NOTE — Telephone Encounter (Signed)
Family history of CAD

## 2018-04-20 NOTE — Addendum Note (Signed)
Addended by: Raford Pitcher R on: 04/20/2018 08:50 AM   Modules accepted: Orders

## 2018-04-22 LAB — FACTOR 5 LEIDEN

## 2018-04-22 LAB — HIV ANTIBODY (ROUTINE TESTING W REFLEX): HIV 1&2 Ab, 4th Generation: NONREACTIVE

## 2018-04-24 ENCOUNTER — Encounter: Payer: Self-pay | Admitting: Internal Medicine

## 2018-04-25 MED ORDER — PITAVASTATIN CALCIUM 2 MG PO TABS: 2.0000 mg | ORAL_TABLET | Freq: Every day | ORAL | 3 refills | Status: DC

## 2018-07-22 ENCOUNTER — Other Ambulatory Visit: Payer: Self-pay | Admitting: Internal Medicine

## 2018-08-01 ENCOUNTER — Other Ambulatory Visit: Payer: Self-pay | Admitting: Internal Medicine

## 2018-08-01 NOTE — Telephone Encounter (Signed)
Called pharmacy last refill 06/20/2018 for 30 tabs LOV: 04/18/2018 [id number]

## 2018-08-04 ENCOUNTER — Other Ambulatory Visit: Payer: Self-pay | Admitting: Internal Medicine

## 2018-08-27 ENCOUNTER — Other Ambulatory Visit: Payer: Self-pay | Admitting: Internal Medicine

## 2018-09-18 IMAGING — DX DG CHEST 2V
2 series · 2 of 2 positions shown · non-contrast
Comparison: None.

CLINICAL DATA: Acute onset of cough and shortness of breath.
Unspecified ankle surgery 3 days ago. Current history of
hypertension. Former smoker.

EXAM:
CHEST - 2 VIEW

[chest pa]
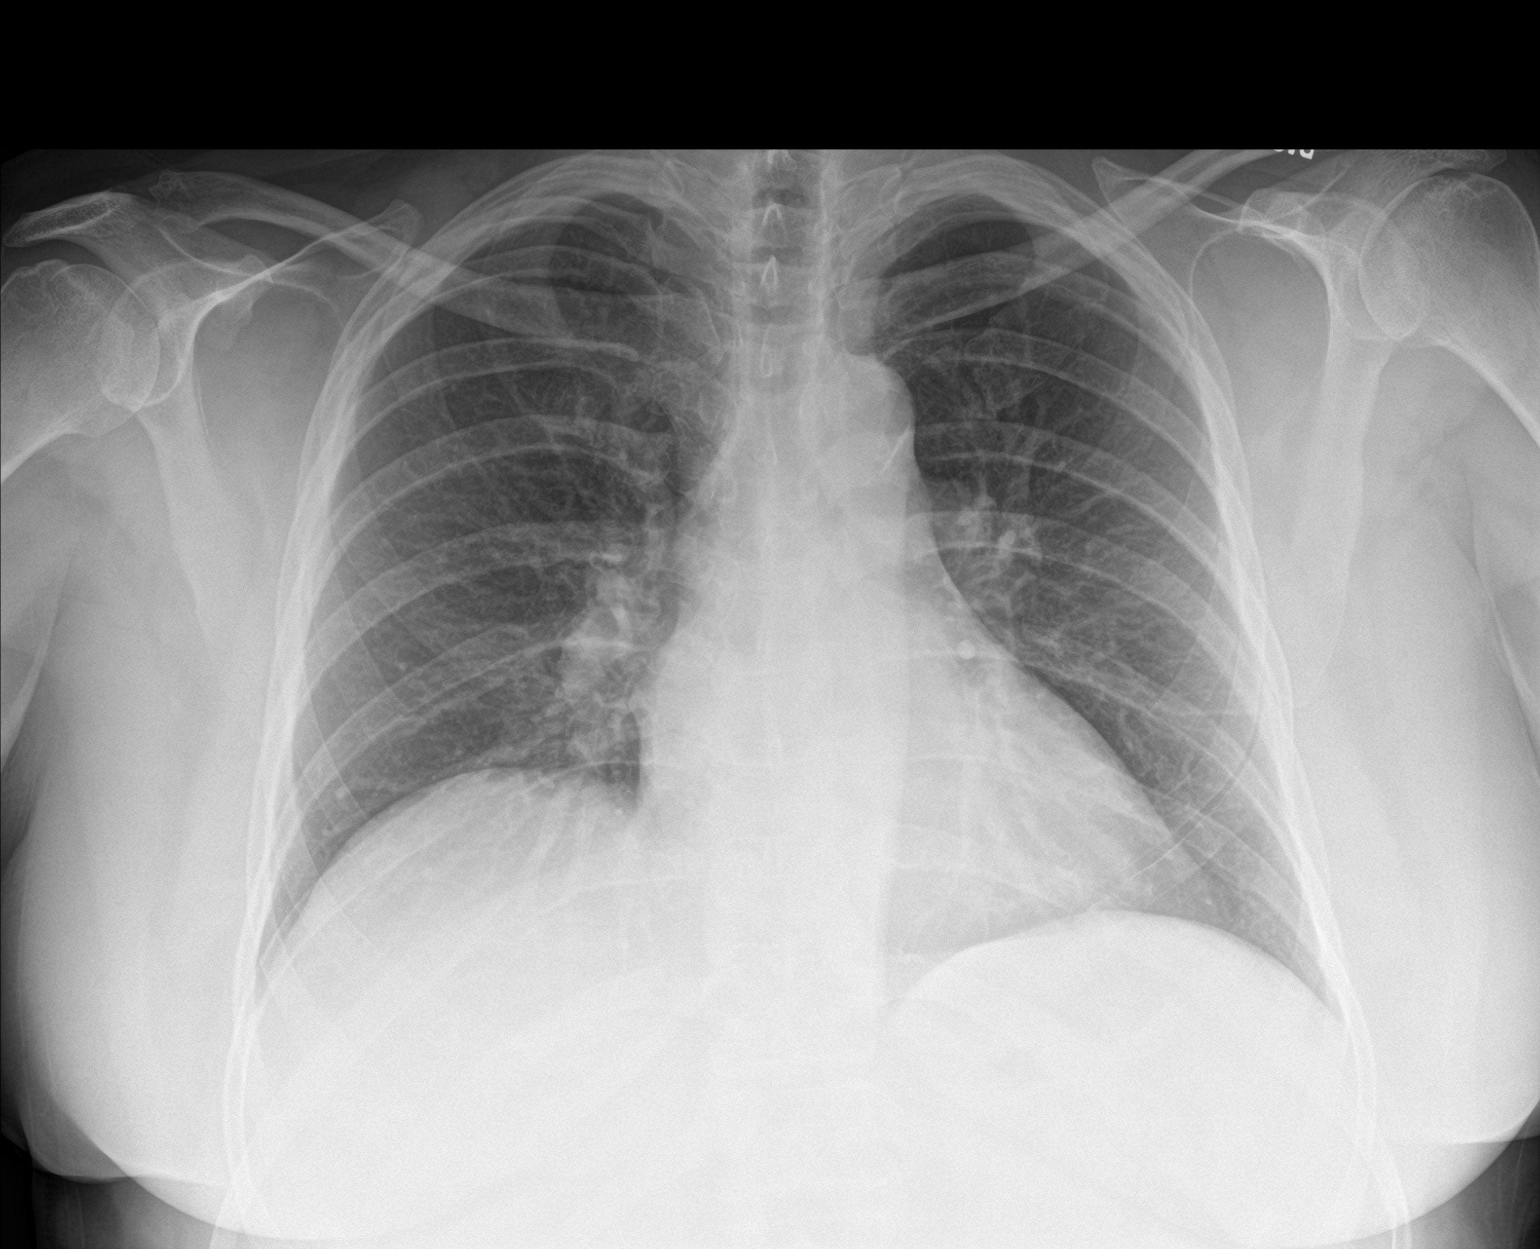

[chest lat]
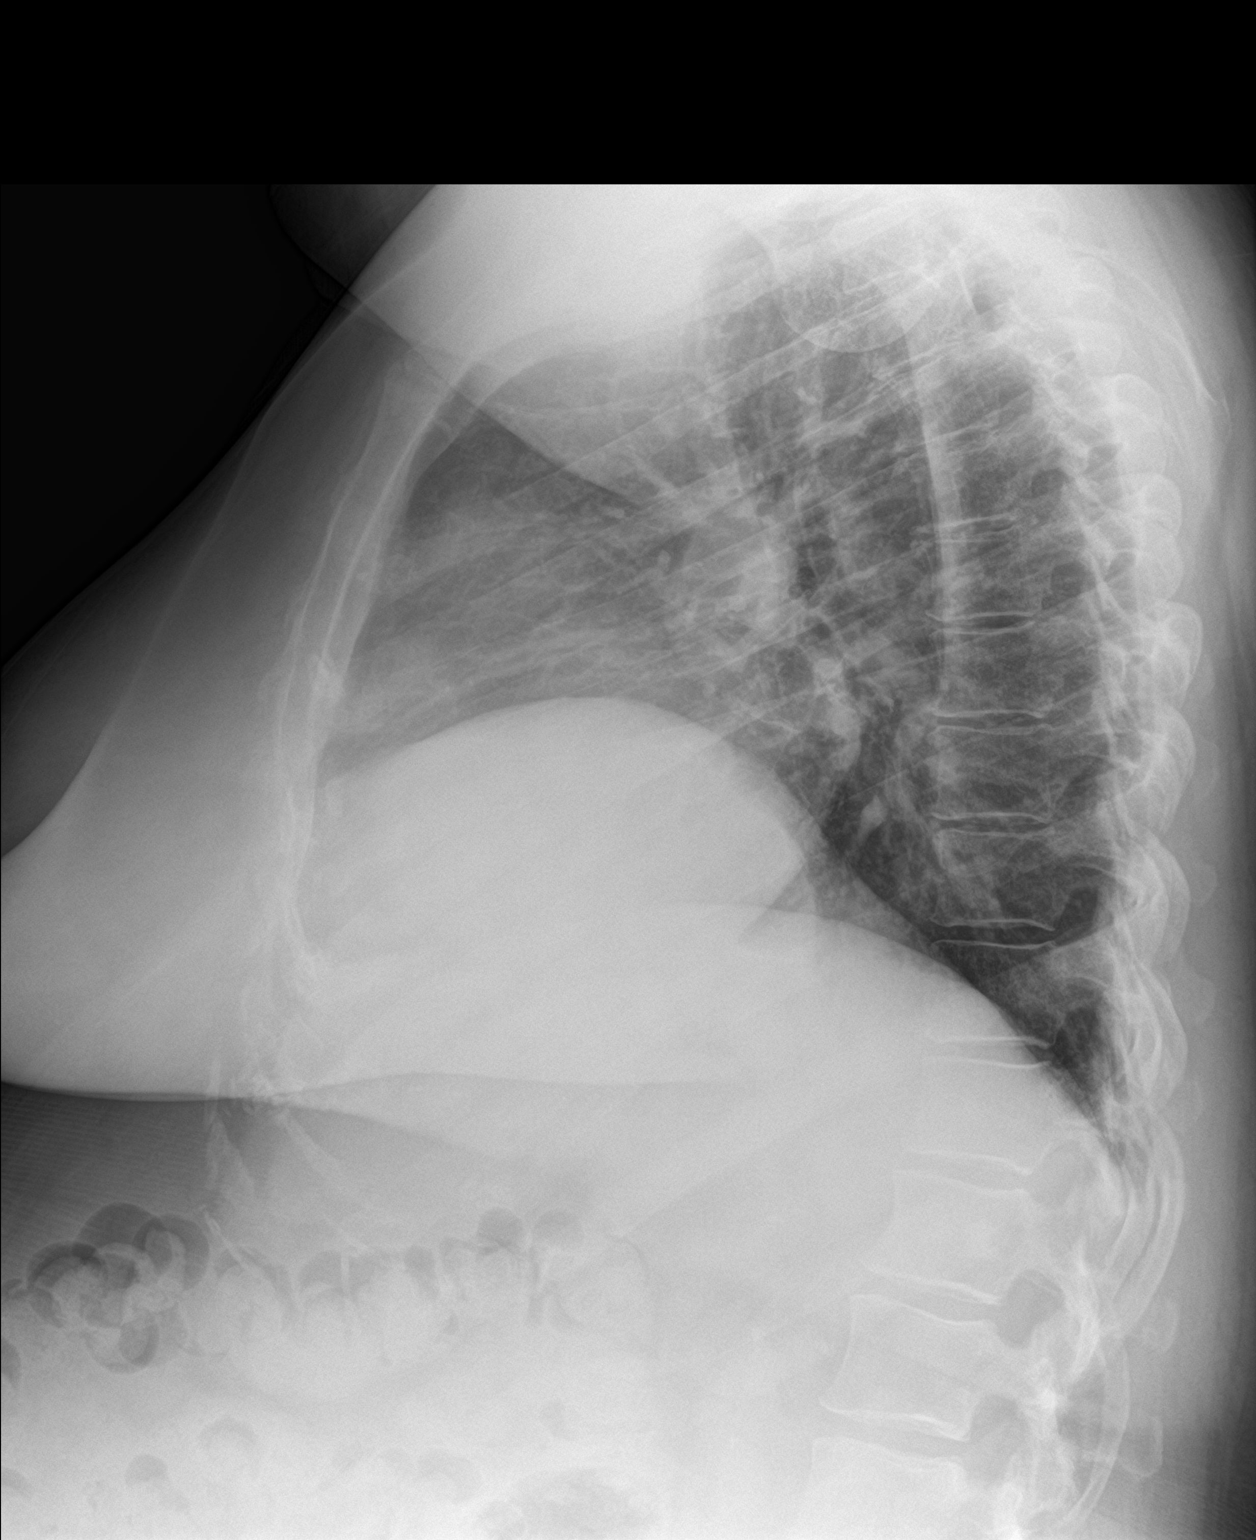

[2 of 2 positions shown; findings below may reference images not displayed]

FINDINGS: Cardiac silhouette normal in size. Thoracic aorta mildly
atherosclerotic. Hilar and mediastinal contours otherwise
unremarkable. Lungs clear. Bronchovascular markings normal.
Pulmonary vascularity normal. No visible pleural effusions. No
pneumothorax. Elevation of the RIGHT hemidiaphragm. Visualized bony
thorax intact.
IMPRESSION: No acute cardiopulmonary disease.

## 2018-11-12 ENCOUNTER — Encounter: Payer: Self-pay | Admitting: Internal Medicine

## 2018-11-13 ENCOUNTER — Encounter: Payer: Self-pay | Admitting: Internal Medicine

## 2018-11-13 ENCOUNTER — Ambulatory Visit (INDEPENDENT_AMBULATORY_CARE_PROVIDER_SITE_OTHER): Payer: 59 | Admitting: Internal Medicine

## 2018-11-13 DIAGNOSIS — R05 Cough: Secondary | ICD-10-CM | POA: Diagnosis not present

## 2018-11-13 DIAGNOSIS — R059 Cough, unspecified: Secondary | ICD-10-CM

## 2018-11-13 MED ORDER — DOXYCYCLINE HYCLATE 100 MG PO TABS: 100.0000 mg | ORAL_TABLET | Freq: Two times a day (BID) | ORAL | 0 refills | Status: DC

## 2018-11-13 MED ORDER — BENZONATATE 200 MG PO CAPS: 200.0000 mg | ORAL_CAPSULE | Freq: Three times a day (TID) | ORAL | 0 refills | Status: DC | PRN

## 2018-11-13 MED ORDER — ALBUTEROL SULFATE HFA 108 (90 BASE) MCG/ACT IN AERS: 2.0000 | INHALATION_SPRAY | Freq: Four times a day (QID) | RESPIRATORY_TRACT | 2 refills | Status: DC | PRN

## 2018-11-13 NOTE — Progress Notes (Signed)
Virtual Visit via Video Note  I connected with Kristen Rowe on 11/13/18 at 11:00 AM EDT by a video enabled telemedicine application and verified that I am speaking with the correct person using two identifiers.  The patient and the provider were at separate locations throughout the entire encounter.   I discussed the limitations of evaluation and management by telemedicine and the availability of in person appointments. The patient expressed understanding and agreed to proceed.  History of Present Illness: The patient is a 58 y.o. female with visit for cough and allergies. She gets this at least once a year. Starts with drainage and this started about 2 weeks ago. She is taking her montelukast and zyrtec and still progressing. She has some fullness in  Her chest and dry cough. Sounds almost barking. She denies SOB. Denies fevers or chills. Denies body aches or headaches. Started 2-3 weeks ago. Denies known exposure to covid-19. Overall it is stable. Has tried mucinex and otc cough and cold medications without relief.  Observations/Objective: Appearance: normal, breathing appears normal, dry cough throughout the visit, casual grooming, abdomen does not appear distended, throat normal, mental status is A and O times 3, EOM intact  Assessment and Plan: See problem oriented charting  Follow Up Instructions: rx doxycycline, albuterol inhaler, tessalon perles  I discussed the assessment and treatment plan with the patient. The patient was provided an opportunity to ask questions and all were answered. The patient agreed with the plan and demonstrated an understanding of the instructions.   The patient was advised to call back or seek an in-person evaluation if the symptoms worsen or if the condition fails to improve as anticipated.  Hoyt Koch, MD

## 2018-11-13 NOTE — Assessment & Plan Note (Signed)
Similar to previous and will treat with doxycycline and tessalon perles and albuterol inhaler. Advised of symptoms of covid-19 to monitor.

## 2019-01-30 ENCOUNTER — Encounter: Payer: Self-pay | Admitting: Internal Medicine

## 2019-01-30 ENCOUNTER — Other Ambulatory Visit: Payer: Self-pay | Admitting: Internal Medicine

## 2019-03-22 ENCOUNTER — Other Ambulatory Visit: Payer: Self-pay

## 2019-03-22 ENCOUNTER — Encounter: Payer: Self-pay | Admitting: Internal Medicine

## 2019-03-22 ENCOUNTER — Ambulatory Visit (INDEPENDENT_AMBULATORY_CARE_PROVIDER_SITE_OTHER): Payer: 59 | Admitting: Internal Medicine

## 2019-03-22 ENCOUNTER — Other Ambulatory Visit (INDEPENDENT_AMBULATORY_CARE_PROVIDER_SITE_OTHER): Payer: 59

## 2019-03-22 VITALS — BP 118/84 | HR 55 | Temp 98.1°F | Ht 67.0 in | Wt 232.0 lb

## 2019-03-22 DIAGNOSIS — E039 Hypothyroidism, unspecified: Secondary | ICD-10-CM

## 2019-03-22 DIAGNOSIS — Z0000 Encounter for general adult medical examination without abnormal findings: Secondary | ICD-10-CM

## 2019-03-22 DIAGNOSIS — R7301 Impaired fasting glucose: Secondary | ICD-10-CM | POA: Diagnosis not present

## 2019-03-22 DIAGNOSIS — F325 Major depressive disorder, single episode, in full remission: Secondary | ICD-10-CM | POA: Diagnosis not present

## 2019-03-22 LAB — COMPREHENSIVE METABOLIC PANEL
ALT: 24 U/L (ref 0–35)
AST: 15 U/L (ref 0–37)
Albumin: 4.1 g/dL (ref 3.5–5.2)
Alkaline Phosphatase: 130 U/L — ABNORMAL HIGH (ref 39–117)
BUN: 11 mg/dL (ref 6–23)
CO2: 30 mEq/L (ref 19–32)
Calcium: 9.8 mg/dL (ref 8.4–10.5)
Chloride: 102 mEq/L (ref 96–112)
Creatinine, Ser: 1.04 mg/dL (ref 0.40–1.20)
GFR: 54.2 mL/min — ABNORMAL LOW (ref >60.00)
Glucose, Bld: 104 mg/dL — ABNORMAL HIGH (ref 70–99)
Potassium: 4.3 mEq/L (ref 3.5–5.1)
Sodium: 138 mEq/L (ref 135–145)
Total Bilirubin: 0.4 mg/dL (ref 0.2–1.2)
Total Protein: 7 g/dL (ref 6.0–8.3)

## 2019-03-22 LAB — VITAMIN D 25 HYDROXY (VIT D DEFICIENCY, FRACTURES): VITD: 33.84 ng/mL (ref 30.00–100.00)

## 2019-03-22 LAB — CBC
HCT: 37.5 % (ref 36.0–46.0)
Hemoglobin: 12.5 g/dL (ref 12.0–15.0)
MCHC: 33.4 g/dL (ref 30.0–36.0)
MCV: 90.9 fl (ref 78.0–100.0)
Platelets: 260 10*3/uL (ref 150.0–400.0)
RBC: 4.12 Mil/uL (ref 3.87–5.11)
RDW: 13.5 % (ref 11.5–15.5)
WBC: 11.3 10*3/uL — ABNORMAL HIGH (ref 4.0–10.5)

## 2019-03-22 LAB — LIPID PANEL
Cholesterol: 140 mg/dL (ref 0–200)
HDL: 36.8 mg/dL — ABNORMAL LOW (ref >39.00)
LDL Cholesterol: 81 mg/dL (ref 0–99)
NonHDL: 102.98
Total CHOL/HDL Ratio: 4
Triglycerides: 111 mg/dL (ref 0.0–149.0)
VLDL: 22.2 mg/dL (ref 0.0–40.0)

## 2019-03-22 LAB — HEMOGLOBIN A1C: Hgb A1c MFr Bld: 6.5 % (ref 4.6–6.5)

## 2019-03-22 LAB — T4, FREE: Free T4: 1.12 ng/dL (ref 0.60–1.60)

## 2019-03-22 LAB — TSH: TSH: 12.27 u[IU]/mL — ABNORMAL HIGH (ref 0.35–4.50)

## 2019-03-22 NOTE — Assessment & Plan Note (Signed)
Taking wellbutrin and celexa and coping well with pandemic. Mood acceptable at this time.

## 2019-03-22 NOTE — Patient Instructions (Signed)
Health Maintenance, Female Adopting a healthy lifestyle and getting preventive care are important in promoting health and wellness. Ask your health care provider about:  The right schedule for you to have regular tests and exams.  Things you can do on your own to prevent diseases and keep yourself healthy. What should I know about diet, weight, and exercise? Eat a healthy diet   Eat a diet that includes plenty of vegetables, fruits, low-fat dairy products, and lean protein.  Do not eat a lot of foods that are high in solid fats, added sugars, or sodium. Maintain a healthy weight Body mass index (BMI) is used to identify weight problems. It estimates body fat based on height and weight. Your health care provider can help determine your BMI and help you achieve or maintain a healthy weight. Get regular exercise Get regular exercise. This is one of the most important things you can do for your health. Most adults should:  Exercise for at least 150 minutes each week. The exercise should increase your heart rate and make you sweat (moderate-intensity exercise).  Do strengthening exercises at least twice a week. This is in addition to the moderate-intensity exercise.  Spend less time sitting. Even light physical activity can be beneficial. Watch cholesterol and blood lipids Have your blood tested for lipids and cholesterol at 59 years of age, then have this test every 5 years. Have your cholesterol levels checked more often if:  Your lipid or cholesterol levels are high.  You are older than 59 years of age.  You are at high risk for heart disease. What should I know about cancer screening? Depending on your health history and family history, you may need to have cancer screening at various ages. This may include screening for:  Breast cancer.  Cervical cancer.  Colorectal cancer.  Skin cancer.  Lung cancer. What should I know about heart disease, diabetes, and high blood  pressure? Blood pressure and heart disease  High blood pressure causes heart disease and increases the risk of stroke. This is more likely to develop in people who have high blood pressure readings, are of African descent, or are overweight.  Have your blood pressure checked: ? Every 3-5 years if you are 18-39 years of age. ? Every year if you are 40 years old or older. Diabetes Have regular diabetes screenings. This checks your fasting blood sugar level. Have the screening done:  Once every three years after age 40 if you are at a normal weight and have a low risk for diabetes.  More often and at a younger age if you are overweight or have a high risk for diabetes. What should I know about preventing infection? Hepatitis B If you have a higher risk for hepatitis B, you should be screened for this virus. Talk with your health care provider to find out if you are at risk for hepatitis B infection. Hepatitis C Testing is recommended for:  Everyone born from 1945 through 1965.  Anyone with known risk factors for hepatitis C. Sexually transmitted infections (STIs)  Get screened for STIs, including gonorrhea and chlamydia, if: ? You are sexually active and are younger than 59 years of age. ? You are older than 59 years of age and your health care provider tells you that you are at risk for this type of infection. ? Your sexual activity has changed since you were last screened, and you are at increased risk for chlamydia or gonorrhea. Ask your health care provider if   you are at risk.  Ask your health care provider about whether you are at high risk for HIV. Your health care provider may recommend a prescription medicine to help prevent HIV infection. If you choose to take medicine to prevent HIV, you should first get tested for HIV. You should then be tested every 3 months for as long as you are taking the medicine. Pregnancy  If you are about to stop having your period (premenopausal) and  you may become pregnant, seek counseling before you get pregnant.  Take 400 to 800 micrograms (mcg) of folic acid every day if you become pregnant.  Ask for birth control (contraception) if you want to prevent pregnancy. Osteoporosis and menopause Osteoporosis is a disease in which the bones lose minerals and strength with aging. This can result in bone fractures. If you are 65 years old or older, or if you are at risk for osteoporosis and fractures, ask your health care provider if you should:  Be screened for bone loss.  Take a calcium or vitamin D supplement to lower your risk of fractures.  Be given hormone replacement therapy (HRT) to treat symptoms of menopause. Follow these instructions at home: Lifestyle  Do not use any products that contain nicotine or tobacco, such as cigarettes, e-cigarettes, and chewing tobacco. If you need help quitting, ask your health care provider.  Do not use street drugs.  Do not share needles.  Ask your health care provider for help if you need support or information about quitting drugs. Alcohol use  Do not drink alcohol if: ? Your health care provider tells you not to drink. ? You are pregnant, may be pregnant, or are planning to become pregnant.  If you drink alcohol: ? Limit how much you use to 0-1 drink a day. ? Limit intake if you are breastfeeding.  Be aware of how much alcohol is in your drink. In the [place] one drink equals one 12 oz bottle of beer (355 mL), one 5 oz glass of wine (148 mL), or one 1 oz glass of hard liquor (44 mL). General instructions  Schedule regular health, dental, and eye exams.  Stay current with your vaccines.  Tell your health care provider if: ? You often feel depressed. ? You have ever been abused or do not feel safe at home. Summary  Adopting a healthy lifestyle and getting preventive care are important in promoting health and wellness.  Follow your health care provider's instructions about healthy  diet, exercising, and getting tested or screened for diseases.  Follow your health care provider's instructions on monitoring your cholesterol and blood pressure. This information is not intended to replace advice given to you by your health care provider. Make sure you discuss any questions you have with your health care provider. Document Released: 12/14/2010 Document Revised: 05/24/2018 Document Reviewed: 05/24/2018 Elsevier Patient Education  2020 Elsevier Inc.  

## 2019-03-22 NOTE — Assessment & Plan Note (Signed)
Checking HgA1c and adjust as needed.  

## 2019-03-22 NOTE — Progress Notes (Signed)
   Subjective:   Patient ID: Kristen Rowe, female    DOB: 01-Aug-1960, 58 y.o.   MRN: [id number]  HPI The patient is a 58 YO female coming in for physical.   PMH, [place], social history reviewed and updated  Review of Systems  Constitutional: Positive for appetite change.  HENT: Negative.   Eyes: Negative.   Respiratory: Negative for cough, chest tightness and shortness of breath.   Cardiovascular: Negative for chest pain, palpitations and leg swelling.  Gastrointestinal: Negative for abdominal distention, abdominal pain, constipation, diarrhea, nausea and vomiting.  Musculoskeletal: Negative.   Skin: Negative.   Neurological: Negative.   Psychiatric/Behavioral: Negative.     Objective:  Physical Exam Constitutional:      Appearance: She is well-developed. She is obese.  HENT:     Head: Normocephalic and atraumatic.  Neck:     Musculoskeletal: Normal range of motion.  Cardiovascular:     Rate and Rhythm: Normal rate and regular rhythm.  Pulmonary:     Effort: Pulmonary effort is normal. No respiratory distress.     Breath sounds: Normal breath sounds. No wheezing or rales.  Abdominal:     General: Bowel sounds are normal. There is no distension.     Palpations: Abdomen is soft.     Tenderness: There is no abdominal tenderness. There is no rebound.  Skin:    General: Skin is warm and dry.  Neurological:     Mental Status: She is alert and oriented to person, place, and time.     Coordination: Coordination normal.     Vitals:   03/22/19 1255  BP: 118/84  Pulse: (!) 55  Temp: 98.1 F (36.7 C)  TempSrc: Oral  SpO2: 99%  Weight: 232 lb (105.2 kg)  Height: 5\' 7"  (1.702 m)    Assessment & Plan:

## 2019-03-22 NOTE — Assessment & Plan Note (Signed)
Flu shot up to date. Shingrix counseled declines. Tetanus up to date. Colonoscopy ordered GI. Mammogram ordered, pap smear advised recommended and she defers today. Counseled about sun safety and mole surveillance. Counseled about the dangers of distracted driving. Given 10 year screening recommendations.

## 2019-03-22 NOTE — Assessment & Plan Note (Signed)
Checking TSH and free T4 and adjust synthroid 137 mcg daily as needed.

## 2019-03-29 ENCOUNTER — Encounter: Payer: Self-pay | Admitting: Internal Medicine

## 2019-04-02 ENCOUNTER — Other Ambulatory Visit: Payer: Self-pay | Admitting: Internal Medicine

## 2019-04-03 NOTE — Telephone Encounter (Signed)
Control database checked last refill: 01/18/2019 30 tabs LOV: 03/22/2019 cpe NOV: none

## 2019-04-13 ENCOUNTER — Other Ambulatory Visit: Payer: Self-pay | Admitting: Internal Medicine

## 2019-05-08 ENCOUNTER — Other Ambulatory Visit: Payer: Self-pay | Admitting: Internal Medicine

## 2019-06-20 ENCOUNTER — Other Ambulatory Visit: Payer: Self-pay | Admitting: Internal Medicine

## 2019-06-26 ENCOUNTER — Other Ambulatory Visit: Payer: Self-pay | Admitting: Internal Medicine

## 2019-07-12 ENCOUNTER — Encounter: Payer: Self-pay | Admitting: Internal Medicine

## 2019-07-14 ENCOUNTER — Encounter: Payer: Self-pay | Admitting: Unknown Physician Specialty

## 2019-07-14 ENCOUNTER — Telehealth: Payer: Self-pay | Admitting: Unknown Physician Specialty

## 2019-07-14 ENCOUNTER — Other Ambulatory Visit: Payer: Self-pay | Admitting: Unknown Physician Specialty

## 2019-07-14 DIAGNOSIS — U071 COVID-19: Secondary | ICD-10-CM

## 2019-07-14 NOTE — Telephone Encounter (Signed)
  I connected by phone with [name] on 07/14/2019 at 10:07 AM to discuss the potential use of an new treatment for mild to moderate COVID-19 viral infection in non-hospitalized patients.  This patient is a 59 y.o. female that meets the FDA criteria for Emergency Use Authorization of bamlanivimab or casirivimab\imdevimab.  Has a (+) direct SARS-CoV-2 viral test result  Has mild or moderate COVID-19   Is ? 60 years of age and weighs ? 40 kg  Is NOT hospitalized due to COVID-19  Is NOT requiring oxygen therapy or requiring an increase in baseline oxygen flow rate due to COVID-19  Is within 10 days of symptom onset  Has at least one of the high risk factor(s) for progression to severe COVID-19 and/or hospitalization as defined in EUA.  Specific high risk criteria : BMI >/= 35   I have spoken and communicated the following to the patient or parent/caregiver:  1. FDA has authorized the emergency use of bamlanivimab and casirivimab\imdevimab for the treatment of mild to moderate COVID-19 in adults and pediatric patients with positive results of direct SARS-CoV-2 viral testing who are 12 years of age and older weighing at least 40 kg, and who are at high risk for progressing to severe COVID-19 and/or hospitalization.  2. The significant known and potential risks and benefits of bamlanivimab and casirivimab\imdevimab, and the extent to which such potential risks and benefits are unknown.  3. Information on available alternative treatments and the risks and benefits of those alternatives, including clinical trials.  4. Patients treated with bamlanivimab and casirivimab\imdevimab should continue to self-isolate and use infection control measures (e.g., wear mask, isolate, social distance, avoid sharing personal items, clean and disinfect "high touch" surfaces, and frequent handwashing) according to CDC guidelines.   5. The patient or parent/caregiver has the option to accept or refuse  bamlanivimab or casirivimab\imdevimab .  After reviewing this information with the patient, The patient agreed to proceed with receiving the casirivimab\imdevimab infusion and will be provided a copy of the Fact sheet prior to receiving the infusion.Kathrine Haddock 07/14/2019 10:07 AM  Symptom onset: 1/28

## 2019-07-16 ENCOUNTER — Ambulatory Visit (HOSPITAL_COMMUNITY)
Admission: RE | Admit: 2019-07-16 | Discharge: 2019-07-16 | Disposition: A | Discharge status: Home / Self Care | Payer: BC Managed Care – PPO | Source: Ambulatory Visit | Attending: Pulmonary Disease | Admitting: Pulmonary Disease

## 2019-07-16 DIAGNOSIS — Z23 Encounter for immunization: Secondary | ICD-10-CM | POA: Insufficient documentation

## 2019-07-16 DIAGNOSIS — U071 COVID-19: Secondary | ICD-10-CM | POA: Insufficient documentation

## 2019-07-16 MED ORDER — SODIUM CHLORIDE 0.9 % IV SOLN
Freq: Once | INTRAVENOUS | Status: AC
  Filled 2019-07-16: qty 10

## 2019-07-16 MED ORDER — METHYLPREDNISOLONE SODIUM SUCC 125 MG IJ SOLR: 125.0000 mg | Freq: Once | INTRAMUSCULAR | Status: DC | PRN

## 2019-07-16 MED ORDER — ALBUTEROL SULFATE HFA 108 (90 BASE) MCG/ACT IN AERS: 2.0000 | INHALATION_SPRAY | Freq: Once | RESPIRATORY_TRACT | Status: DC | PRN

## 2019-07-16 MED ORDER — SODIUM CHLORIDE 0.9 % IV SOLN
INTRAVENOUS | Status: DC | PRN
  Administered 2019-07-16: 250 mL via INTRAVENOUS

## 2019-07-16 MED ORDER — EPINEPHRINE 0.3 MG/0.3ML IJ SOAJ: 0.3000 mg | Freq: Once | INTRAMUSCULAR | Status: DC | PRN

## 2019-07-16 MED ORDER — FAMOTIDINE IN NACL 20-0.9 MG/50ML-% IV SOLN: 20.0000 mg | Freq: Once | INTRAVENOUS | Status: DC | PRN

## 2019-07-16 MED ORDER — DIPHENHYDRAMINE HCL 50 MG/ML IJ SOLN: 50.0000 mg | Freq: Once | INTRAMUSCULAR | Status: DC | PRN

## 2019-07-16 NOTE — Progress Notes (Signed)
  Diagnosis: COVID-19  Physician: Dr. Joya Gaskins  Procedure: [facility] Med: casirivimab\imdevimab infusion - Provided patient with casirivimab\imdevimab fact sheet for patients, parents and caregivers prior to infusion.  Complications: No immediate complications noted.  Discharge: Discharged home   Babs Sciara 07/16/2019

## 2019-07-16 NOTE — Discharge Instructions (Signed)

## 2019-08-03 ENCOUNTER — Encounter: Payer: 59 | Admitting: Internal Medicine

## 2019-08-11 ENCOUNTER — Other Ambulatory Visit: Payer: Self-pay | Admitting: Internal Medicine

## 2019-08-22 ENCOUNTER — Ambulatory Visit (INDEPENDENT_AMBULATORY_CARE_PROVIDER_SITE_OTHER): Payer: BC Managed Care – PPO | Admitting: Internal Medicine

## 2019-08-22 ENCOUNTER — Encounter: Payer: Self-pay | Admitting: Internal Medicine

## 2019-08-22 ENCOUNTER — Other Ambulatory Visit: Payer: Self-pay

## 2019-08-22 VITALS — BP 122/80 | Temp 97.9°F | Ht 67.0 in | Wt 212.0 lb

## 2019-08-22 DIAGNOSIS — E039 Hypothyroidism, unspecified: Secondary | ICD-10-CM

## 2019-08-22 DIAGNOSIS — Z0000 Encounter for general adult medical examination without abnormal findings: Secondary | ICD-10-CM | POA: Diagnosis not present

## 2019-08-22 DIAGNOSIS — R7301 Impaired fasting glucose: Secondary | ICD-10-CM

## 2019-08-22 LAB — COMPREHENSIVE METABOLIC PANEL WITH GFR
ALT: 43 U/L — ABNORMAL HIGH (ref 0–35)
AST: 22 U/L (ref 0–37)
Albumin: 3.8 g/dL (ref 3.5–5.2)
Alkaline Phosphatase: 123 U/L — ABNORMAL HIGH (ref 39–117)
BUN: 16 mg/dL (ref 6–23)
CO2: 30 meq/L (ref 19–32)
Calcium: 9.4 mg/dL (ref 8.4–10.5)
Chloride: 103 meq/L (ref 96–112)
Creatinine, Ser: 1.01 mg/dL (ref 0.40–1.20)
GFR: 55.99 mL/min — ABNORMAL LOW
Glucose, Bld: 102 mg/dL — ABNORMAL HIGH (ref 70–99)
Potassium: 3.8 meq/L (ref 3.5–5.1)
Sodium: 139 meq/L (ref 135–145)
Total Bilirubin: 0.4 mg/dL (ref 0.2–1.2)
Total Protein: 6.9 g/dL (ref 6.0–8.3)

## 2019-08-22 LAB — LIPID PANEL
Cholesterol: 214 mg/dL — ABNORMAL HIGH (ref 0–200)
HDL: 38.1 mg/dL — ABNORMAL LOW (ref >39.00)
LDL Cholesterol: 137 mg/dL — ABNORMAL HIGH (ref 0–99)
NonHDL: 176.31
Total CHOL/HDL Ratio: 6
Triglycerides: 195 mg/dL — ABNORMAL HIGH (ref 0.0–149.0)
VLDL: 39 mg/dL (ref 0.0–40.0)

## 2019-08-22 LAB — CBC
HCT: 37.2 % (ref 36.0–46.0)
Hemoglobin: 12.4 g/dL (ref 12.0–15.0)
MCHC: 33.3 g/dL (ref 30.0–36.0)
MCV: 90.1 fl (ref 78.0–100.0)
Platelets: 263 10*3/uL (ref 150.0–400.0)
RBC: 4.13 Mil/uL (ref 3.87–5.11)
RDW: 13.8 % (ref 11.5–15.5)
WBC: 9.1 10*3/uL (ref 4.0–10.5)

## 2019-08-22 LAB — HEMOGLOBIN A1C: Hgb A1c MFr Bld: 6.1 % (ref 4.6–6.5)

## 2019-08-22 MED ORDER — LISINOPRIL-HYDROCHLOROTHIAZIDE 20-12.5 MG PO TABS: 1.0000 | ORAL_TABLET | Freq: Every day | ORAL | 3 refills | Status: DC

## 2019-08-22 MED ORDER — LIVALO 2 MG PO TABS: 1.0000 | ORAL_TABLET | Freq: Every day | ORAL | 3 refills | Status: DC

## 2019-08-22 MED ORDER — MONTELUKAST SODIUM 10 MG PO TABS: 10.0000 mg | ORAL_TABLET | Freq: Every day | ORAL | 3 refills | Status: DC

## 2019-08-22 MED ORDER — LEVOTHYROXINE SODIUM 137 MCG PO TABS: 137.0000 ug | ORAL_TABLET | Freq: Every day | ORAL | 3 refills | Status: DC

## 2019-08-22 NOTE — Progress Notes (Signed)
   Subjective:   Patient ID: Kristen Rowe, female    DOB: [birth year], 59 y.o.   MRN: [id number]  HPI The patient is a 59 YO female coming in for physical.   PMH, [place], social history reviewed and update  Review of Systems  Constitutional: Negative.   HENT: Negative.   Eyes: Negative.   Respiratory: Negative for cough, chest tightness and shortness of breath.   Cardiovascular: Negative for chest pain, palpitations and leg swelling.  Gastrointestinal: Negative for abdominal distention, abdominal pain, constipation, diarrhea, nausea and vomiting.  Musculoskeletal: Negative.   Skin: Negative.   Neurological: Negative.   Psychiatric/Behavioral: Negative.     Objective:  Physical Exam Constitutional:      Appearance: She is well-developed.  HENT:     Head: Normocephalic and atraumatic.  Cardiovascular:     Rate and Rhythm: Normal rate and regular rhythm.  Pulmonary:     Effort: Pulmonary effort is normal. No respiratory distress.     Breath sounds: Normal breath sounds. No wheezing or rales.  Abdominal:     General: Bowel sounds are normal. There is no distension.     Palpations: Abdomen is soft.     Tenderness: There is no abdominal tenderness. There is no rebound.  Musculoskeletal:     Cervical back: Normal range of motion.  Skin:    General: Skin is warm and dry.  Neurological:     Mental Status: She is alert and oriented to person, place, and time.     Coordination: Coordination normal.     Vitals:   08/22/19 0859  BP: 122/80  Temp: 97.9 F (36.6 C)  TempSrc: Oral  Weight: 212 lb (96.2 kg)  Height: 5\' 7"  (1.702 m)    This visit occurred during the SARS-CoV-2 public health emergency.  Safety protocols were in place, including screening questions prior to the visit, additional usage of staff PPE, and extensive cleaning of exam room while observing appropriate contact time as indicated for disinfecting solutions.   Assessment & Plan:

## 2019-08-22 NOTE — Assessment & Plan Note (Signed)
Checking HgA1c. 

## 2019-08-22 NOTE — Assessment & Plan Note (Signed)
Flu shot up to date. Shingrix counseled. Tetanus up to date. Colonoscopy counseled referral to GI done in past. Mammogram up to date, pap smear up to date. Counseled about sun safety and mole surveillance. Counseled about the dangers of distracted driving. Given 10 year screening recommendations.

## 2019-08-22 NOTE — Patient Instructions (Addendum)
Health Maintenance, Female Adopting a healthy lifestyle and getting preventive care are important in promoting health and wellness. Ask your health care provider about:  The right schedule for you to have regular tests and exams.  Things you can do on your own to prevent diseases and keep yourself healthy. What should I know about diet, weight, and exercise? Eat a healthy diet   Eat a diet that includes plenty of vegetables, fruits, low-fat dairy products, and lean protein.  Do not eat a lot of foods that are high in solid fats, added sugars, or sodium. Maintain a healthy weight Body mass index (BMI) is used to identify weight problems. It estimates body fat based on height and weight. Your health care provider can help determine your BMI and help you achieve or maintain a healthy weight. Get regular exercise Get regular exercise. This is one of the most important things you can do for your health. Most adults should:  Exercise for at least 150 minutes each week. The exercise should increase your heart rate and make you sweat (moderate-intensity exercise).  Do strengthening exercises at least twice a week. This is in addition to the moderate-intensity exercise.  Spend less time sitting. Even light physical activity can be beneficial. Watch cholesterol and blood lipids Have your blood tested for lipids and cholesterol at 60 years of age, then have this test every 5 years. Have your cholesterol levels checked more often if:  Your lipid or cholesterol levels are high.  You are older than 60 years of age.  You are at high risk for heart disease. What should I know about cancer screening? Depending on your health history and family history, you may need to have cancer screening at various ages. This may include screening for:  Breast cancer.  Cervical cancer.  Colorectal cancer.  Skin cancer.  Lung cancer. What should I know about heart disease, diabetes, and high blood  pressure? Blood pressure and heart disease  High blood pressure causes heart disease and increases the risk of stroke. This is more likely to develop in people who have high blood pressure readings, are of African descent, or are overweight.  Have your blood pressure checked: ? Every 3-5 years if you are 18-39 years of age. ? Every year if you are 40 years old or older. Diabetes Have regular diabetes screenings. This checks your fasting blood sugar level. Have the screening done:  Once every three years after age 40 if you are at a normal weight and have a low risk for diabetes.  More often and at a younger age if you are overweight or have a high risk for diabetes. What should I know about preventing infection? Hepatitis B If you have a higher risk for hepatitis B, you should be screened for this virus. Talk with your health care provider to find out if you are at risk for hepatitis B infection. Hepatitis C Testing is recommended for:  Everyone born from 1945 through 1965.  Anyone with known risk factors for hepatitis C. Sexually transmitted infections (STIs)  Get screened for STIs, including gonorrhea and chlamydia, if: ? You are sexually active and are younger than 60 years of age. ? You are older than 60 years of age and your health care provider tells you that you are at risk for this type of infection. ? Your sexual activity has changed since you were last screened, and you are at increased risk for chlamydia or gonorrhea. Ask your health care provider if   you are at risk.  Ask your health care provider about whether you are at high risk for HIV. Your health care provider may recommend a prescription medicine to help prevent HIV infection. If you choose to take medicine to prevent HIV, you should first get tested for HIV. You should then be tested every 3 months for as long as you are taking the medicine. Pregnancy  If you are about to stop having your period (premenopausal) and  you may become pregnant, seek counseling before you get pregnant.  Take 400 to 800 micrograms (mcg) of folic acid every day if you become pregnant.  Ask for birth control (contraception) if you want to prevent pregnancy. Osteoporosis and menopause Osteoporosis is a disease in which the bones lose minerals and strength with aging. This can result in bone fractures. If you are 65 years old or older, or if you are at risk for osteoporosis and fractures, ask your health care provider if you should:  Be screened for bone loss.  Take a calcium or vitamin D supplement to lower your risk of fractures.  Be given hormone replacement therapy (HRT) to treat symptoms of menopause. Follow these instructions at home: Lifestyle  Do not use any products that contain nicotine or tobacco, such as cigarettes, e-cigarettes, and chewing tobacco. If you need help quitting, ask your health care provider.  Do not use street drugs.  Do not share needles.  Ask your health care provider for help if you need support or information about quitting drugs. Alcohol use  Do not drink alcohol if: ? Your health care provider tells you not to drink. ? You are pregnant, may be pregnant, or are planning to become pregnant.  If you drink alcohol: ? Limit how much you use to 0-1 drink a day. ? Limit intake if you are breastfeeding.  Be aware of how much alcohol is in your drink. In the [place] one drink equals one 12 oz bottle of beer (355 mL), one 5 oz glass of wine (148 mL), or one 1 oz glass of hard liquor (44 mL). General instructions  Schedule regular health, dental, and eye exams.  Stay current with your vaccines.  Tell your health care provider if: ? You often feel depressed. ? You have ever been abused or do not feel safe at home. Summary  Adopting a healthy lifestyle and getting preventive care are important in promoting health and wellness.  Follow your health care provider's instructions about healthy  diet, exercising, and getting tested or screened for diseases.  Follow your health care provider's instructions on monitoring your cholesterol and blood pressure. This information is not intended to replace advice given to you by your health care provider. Make sure you discuss any questions you have with your health care provider. Document Revised: 05/24/2018 Document Reviewed: 05/24/2018 Elsevier Patient Education  2020 Elsevier Inc.  

## 2019-08-22 NOTE — Assessment & Plan Note (Signed)
Wants to try switch to generic levothyroxine due to cost of synthroid. Has been unable to tolerate in the past. If unable to tolerate we may be able to do tier reduction.

## 2019-09-03 ENCOUNTER — Encounter: Payer: Self-pay | Admitting: Internal Medicine

## 2019-09-03 DIAGNOSIS — Z1211 Encounter for screening for malignant neoplasm of colon: Secondary | ICD-10-CM

## 2019-09-05 ENCOUNTER — Telehealth: Payer: Self-pay

## 2019-09-05 NOTE — Telephone Encounter (Signed)
KeyQN:[id number])

## 2019-09-11 ENCOUNTER — Encounter: Payer: Self-pay | Admitting: Gastroenterology

## 2019-09-11 ENCOUNTER — Encounter: Payer: Self-pay | Admitting: Internal Medicine

## 2019-09-18 MED ORDER — LEVOTHYROXINE SODIUM 137 MCG PO TABS: 137.0000 ug | ORAL_TABLET | Freq: Every day | ORAL | 3 refills | Status: DC

## 2019-09-19 NOTE — Telephone Encounter (Signed)
PA has been approved and pt has been informed.

## 2019-09-24 ENCOUNTER — Encounter: Payer: Self-pay | Admitting: Internal Medicine

## 2019-10-16 DIAGNOSIS — R270 Ataxia, unspecified: Secondary | ICD-10-CM | POA: Diagnosis not present

## 2019-10-16 DIAGNOSIS — G5601 Carpal tunnel syndrome, right upper limb: Secondary | ICD-10-CM | POA: Diagnosis not present

## 2019-10-16 DIAGNOSIS — M5417 Radiculopathy, lumbosacral region: Secondary | ICD-10-CM | POA: Diagnosis not present

## 2019-10-16 DIAGNOSIS — R201 Hypoesthesia of skin: Secondary | ICD-10-CM | POA: Diagnosis not present

## 2019-10-16 DIAGNOSIS — G603 Idiopathic progressive neuropathy: Secondary | ICD-10-CM | POA: Diagnosis not present

## 2019-10-16 DIAGNOSIS — M2550 Pain in unspecified joint: Secondary | ICD-10-CM | POA: Diagnosis not present

## 2019-10-16 DIAGNOSIS — M25571 Pain in right ankle and joints of right foot: Secondary | ICD-10-CM | POA: Diagnosis not present

## 2019-10-16 DIAGNOSIS — R634 Abnormal weight loss: Secondary | ICD-10-CM | POA: Diagnosis not present

## 2019-10-16 DIAGNOSIS — D529 Folate deficiency anemia, unspecified: Secondary | ICD-10-CM | POA: Diagnosis not present

## 2019-10-16 DIAGNOSIS — M79671 Pain in right foot: Secondary | ICD-10-CM | POA: Diagnosis not present

## 2019-10-16 DIAGNOSIS — M79604 Pain in right leg: Secondary | ICD-10-CM | POA: Diagnosis not present

## 2019-10-17 ENCOUNTER — Encounter: Payer: BC Managed Care – PPO | Admitting: Gastroenterology

## 2019-10-18 ENCOUNTER — Encounter: Payer: Self-pay | Admitting: Internal Medicine

## 2019-10-20 ENCOUNTER — Other Ambulatory Visit: Payer: Self-pay | Admitting: Internal Medicine

## 2019-10-23 ENCOUNTER — Ambulatory Visit: Payer: BC Managed Care – PPO | Admitting: Internal Medicine

## 2019-10-23 ENCOUNTER — Telehealth: Payer: Self-pay

## 2019-10-23 ENCOUNTER — Encounter: Payer: Self-pay | Admitting: Internal Medicine

## 2019-10-23 ENCOUNTER — Other Ambulatory Visit: Payer: Self-pay

## 2019-10-23 VITALS — BP 146/88 | HR 82 | Temp 98.2°F | Ht 67.0 in | Wt 210.0 lb

## 2019-10-23 DIAGNOSIS — R05 Cough: Secondary | ICD-10-CM

## 2019-10-23 DIAGNOSIS — R059 Cough, unspecified: Secondary | ICD-10-CM

## 2019-10-23 MED ORDER — PREDNISONE 20 MG PO TABS: 40.0000 mg | ORAL_TABLET | Freq: Every day | ORAL | 0 refills | Status: DC

## 2019-10-23 MED ORDER — DOXYCYCLINE HYCLATE 100 MG PO TABS: 100.0000 mg | ORAL_TABLET | Freq: Two times a day (BID) | ORAL | 0 refills | Status: DC

## 2019-10-23 NOTE — Telephone Encounter (Signed)
Kristen Rowe with [facility] calling and states that the patient's recent TSH level was 15.08 and would like for Dr Sharlet Salina to address that at the visit today (10/23/2019) at 2:20pm. States that she has been trying all morning to send the results over to [place], but has been unsuccessful. Will try again. Please advise.

## 2019-10-23 NOTE — Patient Instructions (Signed)
We have sent in prednisone to take 2 pills daily for 5 days.   We have also sent in doxycycline to take 1 pill twice a day for 1 week.

## 2019-10-23 NOTE — Progress Notes (Signed)
   Subjective:   Patient ID: Kristen Rowe, female    DOB: [birth year], 59 y.o.   MRN: [id number]  HPI The patient is a 59 YO female coming in for concerns about sore throat, SOB, cough. She does have prior history of getting severe bronchitis. She does have albuterol inhaler and has been using these 1-2 times per day. This does help with the SOB. The cough is mostly non-productive but feels like there is congestion in her lungs. Some sinus pressure and headaches as well. Has been previously vaccinated against covid. Denies fevers, muscle aches. Denies change in taste/smell.  Review of Systems  Constitutional: Positive for activity change. Negative for chills, fatigue, fever and unexpected weight change.  HENT: Positive for congestion, postnasal drip, rhinorrhea and sinus pressure. Negative for ear discharge, ear pain, sinus pain, sneezing, sore throat, tinnitus, trouble swallowing and voice change.   Eyes: Negative.   Respiratory: Positive for cough. Negative for chest tightness, shortness of breath and wheezing.   Cardiovascular: Negative.   Gastrointestinal: Negative.   Neurological: Negative.     Objective:  Physical Exam Constitutional:      Appearance: She is well-developed.  HENT:     Head: Normocephalic and atraumatic.  Cardiovascular:     Rate and Rhythm: Normal rate and regular rhythm.  Pulmonary:     Effort: Pulmonary effort is normal. No respiratory distress.     Breath sounds: Rhonchi present. No wheezing or rales.     Comments: Mild coarse rhonchi Abdominal:     General: Bowel sounds are normal. There is no distension.     Palpations: Abdomen is soft.     Tenderness: There is no abdominal tenderness. There is no rebound.  Musculoskeletal:     Cervical back: Normal range of motion.  Skin:    General: Skin is warm and dry.  Neurological:     Mental Status: She is alert and oriented to person, place, and time.     Coordination: Coordination normal.     Vitals:   10/23/19 1438  BP: (!) 146/88  Pulse: 82  Temp: 98.2 F (36.8 C)  SpO2: 99%  Weight: 210 lb (95.3 kg)  Height: 5\' 7"  (1.702 m)    This visit occurred during the SARS-CoV-2 public health emergency.  Safety protocols were in place, including screening questions prior to the visit, additional usage of staff PPE, and extensive cleaning of exam room while observing appropriate contact time as indicated for disinfecting solutions.   Assessment & Plan:

## 2019-10-24 NOTE — Telephone Encounter (Signed)
Please advise 

## 2019-10-25 ENCOUNTER — Encounter: Payer: Self-pay | Admitting: Internal Medicine

## 2019-10-25 ENCOUNTER — Telehealth: Payer: Self-pay | Admitting: *Deleted

## 2019-10-25 NOTE — Telephone Encounter (Signed)
Dr. Ardis Hughs This pt was seen in 2020 by cardiology for chest pain- 07-28-19.  She has a hx of CAD and HTN.  I could not pull up the ECHO results and do not see that she was to have a follow up.  No further c/o chest pain noted in other office visits. [place] for direct or would you like an OV?  She is coming in for a screening colonoscopy.   Thanks, [employer]

## 2019-10-25 NOTE — Assessment & Plan Note (Signed)
Concern for CAP, given symptoms we cannot get x-ray today. Rx prednisone and doxycycline. If no improvement may need covid-19 testing. Continue taking her allergy medications.

## 2019-10-26 ENCOUNTER — Encounter: Payer: Self-pay | Admitting: Gastroenterology

## 2019-10-26 NOTE — Telephone Encounter (Signed)
Called pt to schedule OV- no answer- LM to [facility] to sch OV with Dr Ardis Hughs or APP

## 2019-10-26 NOTE — Telephone Encounter (Signed)
Need to gather more information about her heart.  OV is safest here.  Myself or extender, first available. Thanks

## 2019-10-26 NOTE — Telephone Encounter (Signed)
I did go ahead and cancel the PV and her scheduled colon as she needs OV - Kristen Rowe

## 2019-10-29 ENCOUNTER — Encounter: Payer: Self-pay | Admitting: Internal Medicine

## 2019-11-09 DIAGNOSIS — F329 Major depressive disorder, single episode, unspecified: Secondary | ICD-10-CM | POA: Diagnosis not present

## 2019-11-09 DIAGNOSIS — E278 Other specified disorders of adrenal gland: Secondary | ICD-10-CM | POA: Diagnosis not present

## 2019-11-09 DIAGNOSIS — Z87891 Personal history of nicotine dependence: Secondary | ICD-10-CM | POA: Diagnosis not present

## 2019-11-09 DIAGNOSIS — R52 Pain, unspecified: Secondary | ICD-10-CM | POA: Diagnosis not present

## 2019-11-09 DIAGNOSIS — J849 Interstitial pulmonary disease, unspecified: Secondary | ICD-10-CM | POA: Diagnosis not present

## 2019-11-09 DIAGNOSIS — E785 Hyperlipidemia, unspecified: Secondary | ICD-10-CM | POA: Diagnosis not present

## 2019-11-09 DIAGNOSIS — R58 Hemorrhage, not elsewhere classified: Secondary | ICD-10-CM | POA: Diagnosis not present

## 2019-11-09 DIAGNOSIS — E039 Hypothyroidism, unspecified: Secondary | ICD-10-CM | POA: Diagnosis not present

## 2019-11-09 DIAGNOSIS — K56699 Other intestinal obstruction unspecified as to partial versus complete obstruction: Secondary | ICD-10-CM | POA: Diagnosis not present

## 2019-11-09 DIAGNOSIS — R000 Tachycardia, unspecified: Secondary | ICD-10-CM | POA: Diagnosis not present

## 2019-11-09 DIAGNOSIS — I159 Secondary hypertension, unspecified: Secondary | ICD-10-CM | POA: Diagnosis not present

## 2019-11-09 DIAGNOSIS — R1084 Generalized abdominal pain: Secondary | ICD-10-CM | POA: Diagnosis not present

## 2019-11-09 DIAGNOSIS — Z20822 Contact with and (suspected) exposure to covid-19: Secondary | ICD-10-CM | POA: Diagnosis not present

## 2019-11-09 DIAGNOSIS — K420 Umbilical hernia with obstruction, without gangrene: Secondary | ICD-10-CM | POA: Diagnosis not present

## 2019-11-09 DIAGNOSIS — N281 Cyst of kidney, acquired: Secondary | ICD-10-CM | POA: Diagnosis not present

## 2019-11-09 DIAGNOSIS — D1771 Benign lipomatous neoplasm of kidney: Secondary | ICD-10-CM | POA: Diagnosis not present

## 2019-11-09 DIAGNOSIS — R0902 Hypoxemia: Secondary | ICD-10-CM | POA: Diagnosis not present

## 2019-11-09 DIAGNOSIS — Z79899 Other long term (current) drug therapy: Secondary | ICD-10-CM | POA: Diagnosis not present

## 2019-11-09 DIAGNOSIS — E876 Hypokalemia: Secondary | ICD-10-CM | POA: Diagnosis not present

## 2019-11-09 DIAGNOSIS — Z6831 Body mass index (BMI) 31.0-31.9, adult: Secondary | ICD-10-CM | POA: Diagnosis not present

## 2019-11-09 DIAGNOSIS — Z885 Allergy status to narcotic agent status: Secondary | ICD-10-CM | POA: Diagnosis not present

## 2019-11-09 DIAGNOSIS — E669 Obesity, unspecified: Secondary | ICD-10-CM | POA: Diagnosis not present

## 2019-11-09 DIAGNOSIS — R1110 Vomiting, unspecified: Secondary | ICD-10-CM | POA: Diagnosis not present

## 2019-11-09 HISTORY — PX: HERNIA REPAIR: SHX51

## 2019-11-10 DIAGNOSIS — I159 Secondary hypertension, unspecified: Secondary | ICD-10-CM | POA: Diagnosis not present

## 2019-11-10 DIAGNOSIS — E039 Hypothyroidism, unspecified: Secondary | ICD-10-CM | POA: Diagnosis not present

## 2019-11-10 DIAGNOSIS — K420 Umbilical hernia with obstruction, without gangrene: Secondary | ICD-10-CM | POA: Diagnosis not present

## 2019-11-10 DIAGNOSIS — E876 Hypokalemia: Secondary | ICD-10-CM | POA: Diagnosis not present

## 2019-11-12 DIAGNOSIS — E039 Hypothyroidism, unspecified: Secondary | ICD-10-CM | POA: Diagnosis not present

## 2019-11-12 DIAGNOSIS — I159 Secondary hypertension, unspecified: Secondary | ICD-10-CM | POA: Diagnosis not present

## 2019-11-12 DIAGNOSIS — K420 Umbilical hernia with obstruction, without gangrene: Secondary | ICD-10-CM | POA: Diagnosis not present

## 2019-11-12 DIAGNOSIS — E876 Hypokalemia: Secondary | ICD-10-CM | POA: Diagnosis not present

## 2019-11-18 ENCOUNTER — Other Ambulatory Visit: Payer: Self-pay | Admitting: Internal Medicine

## 2019-11-19 NOTE — Telephone Encounter (Signed)
09/15/2019 Zolpidem Tartrate 10 Mg Tablet 30#  Last ov 10/23/19 Next ov n/s

## 2019-11-20 ENCOUNTER — Encounter: Payer: BC Managed Care – PPO | Admitting: Gastroenterology

## 2019-11-25 ENCOUNTER — Other Ambulatory Visit: Payer: Self-pay | Admitting: Internal Medicine

## 2019-11-26 DIAGNOSIS — M2669 Other specified disorders of temporomandibular joint: Secondary | ICD-10-CM | POA: Diagnosis not present

## 2019-11-26 DIAGNOSIS — H9201 Otalgia, right ear: Secondary | ICD-10-CM | POA: Diagnosis not present

## 2019-12-04 ENCOUNTER — Encounter: Payer: Self-pay | Admitting: Gastroenterology

## 2019-12-04 ENCOUNTER — Ambulatory Visit: Payer: BC Managed Care – PPO | Admitting: Gastroenterology

## 2019-12-04 VITALS — BP 130/64 | HR 60 | Ht 67.0 in | Wt 201.0 lb

## 2019-12-04 DIAGNOSIS — K5909 Other constipation: Secondary | ICD-10-CM

## 2019-12-04 DIAGNOSIS — Z1211 Encounter for screening for malignant neoplasm of colon: Secondary | ICD-10-CM | POA: Diagnosis not present

## 2019-12-04 MED ORDER — PLENVU 140 G PO SOLR: 1.0000 | ORAL | 0 refills | Status: DC

## 2019-12-04 NOTE — Progress Notes (Signed)
HPI: This is a very pleasant 59 year old woman who was referred to me by Hoyt Koch, *  to evaluate colon cancer screening.    She was originally scheduled to have a "direct" screening colonoscopy however nurse review of her chart revealed potential significant cardiac issues and so we asked her instead to come in for an office appointment first.    She saw cardiologist at [facility], she underwent echocardiogram and stress testing.  Blood work March 2021 shows normal CBC  She was at a [facility] facility last month with an incarcerated umbilical hernia and underwent urgent surgery, repair of the hernia.  There was no necrotic bowel.  I do believe mesh was placed during the repair.  She tells me that she had known that she had a small umbilical hernia that would intermittently bulge but never really caused any other symptoms.  Then she had bronchitis followed by norovirus and then a day or so after that she had significant abdominal pain which proved to be a small bowel obstruction due to incarcerated umbilical hernia.  She has recovered from that surgery fairly well.  She does still have some gurgling in her abdomen.  The postsurgical pain is about as expected, gradually improving.  She also has chronic constipation.  She takes MiraLAX on an every other day or every third day basis.  She often needs a suppository to stimulate her bowels.  This has been her bowel situation for 5 years at least.  She intentionally lost 50 pounds in the past 1 year with significant dietary changes.  She did see a cardiologist about 1 year ago at [facility].  She underwent stress test as well as echocardiogram and she was told that her results were all normal.  She has had no chest pains.  Review of systems: Pertinent positive and negative review of systems were noted in the above HPI section. All other review negative.   Past Medical History:  Diagnosis Date  . Thyroid disease     Past Surgical History:   Procedure Laterality Date  . ANKLE SURGERY Right 02/10/2018  . APPENDECTOMY    . CHOLECYSTECTOMY    . HERNIA REPAIR  11/09/2019   with blokage    Current Outpatient Medications  Medication Sig Dispense Refill  . albuterol (VENTOLIN HFA) 108 (90 Base) MCG/ACT inhaler Inhale 2 puffs into the lungs every 6 (six) hours as needed for wheezing or shortness of breath. 1 Inhaler 2  . buPROPion (WELLBUTRIN XL) 150 MG 24 hr tablet Take 1 tablet by mouth once daily 90 tablet 1  . citalopram (CELEXA) 20 MG tablet Take 1 tablet by mouth once daily 90 tablet 1  . levothyroxine (SYNTHROID) 137 MCG tablet Take 1 tablet (137 mcg total) by mouth daily before breakfast. 90 tablet 3  . lisinopril-hydrochlorothiazide (ZESTORETIC) 20-12.5 MG tablet Take 1 tablet by mouth daily. 90 tablet 3  . metoprolol tartrate (LOPRESSOR) 50 MG tablet Take 1 tablet by mouth twice daily 180 tablet 2  . montelukast (SINGULAIR) 10 MG tablet Take 1 tablet (10 mg total) by mouth at bedtime. 90 tablet 3  . Pitavastatin Calcium (LIVALO) 2 MG TABS Take 1 tablet (2 mg total) by mouth daily. 90 tablet 3  . zolpidem (AMBIEN) 10 MG tablet TAKE 1 TABLET BY MOUTH AT BEDTIME AS NEEDED FOR SLEEP 30 tablet 2   No current facility-administered medications for this visit.    Allergies as of 12/04/2019 - Review Complete 12/04/2019  Allergen Reaction Noted  . Codeine  Itching 03/25/2017    Family History  Problem Relation Age of Onset  . Cancer Mother        lung  . Heart disease Mother   . Hypertension Mother   . Heart disease Father   . Hypertension Father   . Hypertension Sister   . Hypertension Brother     Social History   Socioeconomic History  . Marital status: Single    Spouse name: Not on file  . Number of children: Not on file  . Years of education: Not on file  . Highest education level: Not on file  Occupational History  . Not on file  Tobacco Use  . Smoking status: Former Smoker    Quit date: 04/26/2016     Years since quitting: 3.6  . Smokeless tobacco: Never Used  Substance and Sexual Activity  . Alcohol use: No  . Drug use: No  . Sexual activity: Not on file  Other Topics Concern  . Not on file  Social History Narrative  . Not on file   Social Determinants of Health   Financial Resource Strain:   . Difficulty of Paying Living Expenses:   Food Insecurity:   . Worried About Charity fundraiser in the Last Year:   . Arboriculturist in the Last Year:   Transportation Needs:   . Film/video editor (Medical):   Marland Kitchen Lack of Transportation (Non-Medical):   Physical Activity:   . Days of Exercise per Week:   . Minutes of Exercise per Session:   Stress:   . Feeling of Stress :   Social Connections:   . Frequency of Communication with Friends and Family:   . Frequency of Social Gatherings with Friends and Family:   . Attends Religious Services:   . Active Member of Clubs or Organizations:   . Attends Archivist Meetings:   Marland Kitchen Marital Status:   Intimate Partner Violence:   . Fear of Current or Ex-Partner:   . Emotionally Abused:   Marland Kitchen Physically Abused:   . Sexually Abused:      Physical Exam: BP 130/64   Pulse 60   Ht 5\' 7"  (1.702 m)   Wt 201 lb (91.2 kg)   BMI 31.48 kg/m  Constitutional: generally well-appearing Psychiatric: alert and oriented x3 Eyes: extraocular movements intact Mouth: oral pharynx moist, no lesions Neck: supple no lymphadenopathy Cardiovascular: heart regular rate and rhythm Lungs: clear to auscultation bilaterally Abdomen: soft, nontender, nondistended, no obvious ascites, no peritoneal signs, normal bowel sounds Extremities: no lower extremity edema bilaterally Skin: no lesions on visible extremities   Assessment and plan: 59 y.o. female with routine risk for colon cancer, chronic constipation, recent incarcerated umbilical hernia surgery  First I think she does need a colonoscopy at her soonest convenience for routine risk colon  cancer screening.  I would like to wait until mid July or so to give her a few more weeks to recover from her incarcerated umbilical hernia repair which was about 3 weeks ago.  She does have mild chronic constipation, seems functional.  She takes MiraLAX on an every other or every third day basis and I asked her to start taking fiber supplement Citrucel as well and I hope that over time she will need less of the MiraLAX and certainly less of the suppositories.   Please see the "Patient Instructions" section for addition details about the plan.   Owens Loffler, MD [facility] 12/04/2019, 1:38 PM  Cc: Sharlet Salina,  Elizabeth A, *  Total time on date of encounter was 45 minutes (this included time spent preparing to see the patient reviewing records; obtaining and/or reviewing separately obtained history; performing a medically appropriate exam and/or evaluation; counseling and educating the patient and family if present; ordering medications, tests or procedures if applicable; and documenting clinical information in the health record).

## 2019-12-04 NOTE — Patient Instructions (Signed)
If you are age 60 or older, your body mass index should be between 23-30. Your Body mass index is 31.48 kg/m. If this is out of the aforementioned range listed, please consider follow up with your Primary Care Provider.  If you are age 22 or younger, your body mass index should be between 19-25. Your Body mass index is 31.48 kg/m. If this is out of the aformentioned range listed, please consider follow up with your Primary Care Provider.   You have been scheduled for a colonoscopy. Please follow written instructions given to you at your visit today.  Please pick up your prep supplies at the pharmacy within the next 1-3 days. If you use inhalers (even only as needed), please bring them with you on the day of your procedure.  Due to recent changes in healthcare laws, you may see the results of your imaging and laboratory studies on MyChart before your provider has had a chance to review them.  We understand that in some cases there may be results that are confusing or concerning to you. Not all laboratory results come back in the same time frame and the provider may be waiting for multiple results in order to interpret others.  Please give [place] 48 hours in order for your provider to thoroughly review all the results before contacting the office for clarification of your results.   Please purchase the following medications over the counter and take as directed:  START: citrucel (orange flavored) powder fiber supplement.  This may cause some bloating at first but that usually goes away. Begin with a small spoonful and work your way up to a large, heaping spoonful daily over a week.  Thank you for entrusting me with your care and choosing [facility].  Dr Ardis Hughs

## 2020-01-01 ENCOUNTER — Encounter: Payer: Self-pay | Admitting: Certified Registered Nurse Anesthetist

## 2020-01-01 ENCOUNTER — Telehealth: Payer: Self-pay | Admitting: Internal Medicine

## 2020-01-01 ENCOUNTER — Encounter: Payer: Self-pay | Admitting: Internal Medicine

## 2020-01-01 ENCOUNTER — Other Ambulatory Visit: Payer: Self-pay | Admitting: Internal Medicine

## 2020-01-01 DIAGNOSIS — E039 Hypothyroidism, unspecified: Secondary | ICD-10-CM

## 2020-01-01 NOTE — Telephone Encounter (Signed)
New Message:   Pt is calling and states she is needing a requesting a referral to the Endocrinologist. Pt has been informed that Dr Sharlet Salina is not in the office this week. Please advise.

## 2020-01-01 NOTE — Telephone Encounter (Signed)
I have received her MyChart messag in regard and have forwarded it to a physician in office to be advised.

## 2020-01-02 ENCOUNTER — Encounter: Payer: Self-pay | Admitting: Gastroenterology

## 2020-01-02 ENCOUNTER — Ambulatory Visit (AMBULATORY_SURGERY_CENTER): Payer: BC Managed Care – PPO | Admitting: Gastroenterology

## 2020-01-02 ENCOUNTER — Other Ambulatory Visit: Payer: Self-pay

## 2020-01-02 VITALS — BP 117/74 | HR 56 | Temp 97.4°F | Resp 20 | Ht 67.0 in | Wt 201.0 lb

## 2020-01-02 DIAGNOSIS — D123 Benign neoplasm of transverse colon: Secondary | ICD-10-CM

## 2020-01-02 DIAGNOSIS — Z1211 Encounter for screening for malignant neoplasm of colon: Secondary | ICD-10-CM | POA: Diagnosis not present

## 2020-01-02 DIAGNOSIS — D128 Benign neoplasm of rectum: Secondary | ICD-10-CM | POA: Diagnosis not present

## 2020-01-02 DIAGNOSIS — D120 Benign neoplasm of cecum: Secondary | ICD-10-CM

## 2020-01-02 MED ORDER — SODIUM CHLORIDE 0.9 % IV SOLN: 500.0000 mL | Freq: Once | INTRAVENOUS | Status: DC

## 2020-01-02 NOTE — Progress Notes (Signed)
VS by CW  Pt's states no medical or surgical changes since previsit or office visit.  

## 2020-01-02 NOTE — Patient Instructions (Signed)
Read all of the handouts given to you by your recovery room nurse. ? ?YOU HAD AN ENDOSCOPIC PROCEDURE TODAY AT THE [facility]:   Refer to the procedure report that was given to you for any specific questions about what was found during the examination.  If the procedure report does not answer your questions, please call your gastroenterologist to clarify.  If you requested that your care partner not be given the details of your procedure findings, then the procedure report has been included in a sealed envelope for you to review at your convenience later. ? ?YOU SHOULD EXPECT: Some feelings of bloating in the abdomen. Passage of more gas than usual.  Walking can help get rid of the air that was put into your GI tract during the procedure and reduce the bloating. If you had a lower endoscopy (such as a colonoscopy or flexible sigmoidoscopy) you may notice spotting of blood in your stool or on the toilet paper. If you underwent a bowel prep for your procedure, you may not have a normal bowel movement for a few days. ? ?Please Note:  You might notice some irritation and congestion in your nose or some drainage.  This is from the oxygen used during your procedure.  There is no need for concern and it should clear up in a day or so. ? ?SYMPTOMS TO REPORT IMMEDIATELY: ? ?Following lower endoscopy (colonoscopy or flexible sigmoidoscopy): ? Excessive amounts of blood in the stool ? Significant tenderness or worsening of abdominal pains ? Swelling of the abdomen that is new, acute ? Fever of 100?F or higher ? ?  ?For urgent or emergent issues, a gastroenterologist can be reached at any hour by calling [phone]. ?Do not use MyChart messaging for urgent concerns.  ? ? ?DIET:  We do recommend a small meal at first, but then you may proceed to your regular diet.  Drink plenty of fluids but you should avoid alcoholic beverages for 24 hours.  Try to eat a high fiber diet, and drink plenty of water. ? ?ACTIVITY:   You should plan to take it easy for the rest of today and you should NOT DRIVE or use heavy machinery until tomorrow (because of the sedation medicines used during the test).   ? ?FOLLOW UP: ?Our staff will call the number listed on your records 48-72 hours following your procedure to check on you and address any questions or concerns that you may have regarding the information given to you following your procedure. If we do not reach you, we will leave a message.  We will attempt to reach you two times.  During this call, we will ask if you have developed any symptoms of COVID 19. If you develop any symptoms (ie: fever, flu-like symptoms, shortness of breath, cough etc.) before then, please call [phone].  If you test positive for Covid 19 in the 2 weeks post procedure, please call and report this information to us.   ? ?If any biopsies were taken you will be contacted by phone or by letter within the next 1-3 weeks.  Please call us at [phone] if you have not heard about the biopsies in 3 weeks.  ? ? ?SIGNATURES/CONFIDENTIALITY: ?You and/or your care partner have signed paperwork which will be entered into your electronic medical record.  These signatures attest to the fact that that the information above on your After Visit Summary has been reviewed and is understood.  Full responsibility of the confidentiality of   this discharge information lies with you and/or your care-partner.  ?

## 2020-01-02 NOTE — Op Note (Addendum)
[facility] Patient Name: Kristen Rowe Procedure Date: 01/02/2020 2:57 PM MRN: [id number] Endoscopist: Milus Banister , MD Age: 59 Referring MD:  Date of Birth: 1961/04/02 Gender: Female Account #: [id number] Procedure:                Colonoscopy Indications:              Screening for colorectal malignant neoplasm Medicines:                Monitored Anesthesia Care Procedure:                Pre-Anesthesia Assessment:                           - Prior to the procedure, a History and Physical                            was performed, and patient medications and                            allergies were reviewed. The patient's tolerance of                            previous anesthesia was also reviewed. The risks                            and benefits of the procedure and the sedation                            options and risks were discussed with the patient.                            All questions were answered, and informed consent                            was obtained. Prior Anticoagulants: The patient has                            taken no previous anticoagulant or antiplatelet                            agents. ASA Grade Assessment: II - A patient with                            mild systemic disease. After reviewing the risks                            and benefits, the patient was deemed in                            satisfactory condition to undergo the procedure.                           After obtaining informed consent, the colonoscope  was passed under direct vision. Throughout the                            procedure, the patient's blood pressure, pulse, and                            oxygen saturations were monitored continuously. The                            Colonoscope was introduced through the anus and                            advanced to the the cecum, identified by                            appendiceal orifice and  ileocecal valve. The                            colonoscopy was performed without difficulty. The                            patient tolerated the procedure well. The quality                            of the bowel preparation was good. The ileocecal                            valve, appendiceal orifice, and rectum were                            photographed. Scope In: 3:05:26 PM Scope Out: 3:18:19 PM Scope Withdrawal Time: 0 hours 8 minutes 57 seconds  Total Procedure Duration: 0 hours 12 minutes 53 seconds  Findings:                 Three sessile polyps were found in the rectum,                            transverse colon and cecum. The polyps were 2 to 4                            mm in size. These polyps were removed with a cold                            snare. Resection and retrieval were complete.                           Multiple small and large-mouthed diverticula were                            found in the left colon.                           The exam was otherwise without abnormality on  direct and retroflexion views. Complications:            No immediate complications. Estimated blood loss:                            None. Estimated Blood Loss:     Estimated blood loss: none. Impression:               - Three 2 to 4 mm polyps in the rectum, in the                            transverse colon and in the cecum, removed with a                            cold snare. Resected and retrieved.                           - Diverticulosis in the left colon.                           - The examination was otherwise normal on direct                            and retroflexion views. Recommendation:           - Patient has a contact number available for                            emergencies. The signs and symptoms of potential                            delayed complications were discussed with the                            patient. Return to normal  activities tomorrow.                            Written discharge instructions were provided to the                            patient.                           - Resume previous diet.                           - Continue present medications.                           - Await pathology results.                           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 I am going to enquire about  anorectal manometry options and refer her after                            that. Milus Banister, MD 01/02/2020 3:20:25 PM This report has been signed electronically.

## 2020-01-02 NOTE — Progress Notes (Signed)
Report given to PACU, vss 

## 2020-01-03 ENCOUNTER — Other Ambulatory Visit: Payer: Self-pay

## 2020-01-03 ENCOUNTER — Telehealth: Payer: Self-pay

## 2020-01-03 DIAGNOSIS — K5909 Other constipation: Secondary | ICD-10-CM

## 2020-01-03 NOTE — Telephone Encounter (Signed)
-----   Message from Mauri Pole, MD sent at 01/02/2020  4:51 PM EDT ----- Yes, happy to help. We can schedule her for anorectal at [facility], process is similar to esophageal manometry. Please let me know if you are having any trouble with scheduling anorectal.  Thanks VN ----- Message ----- From: Milus Banister, MD Sent: 01/02/2020   3:32 PM EDT To: Mauri Pole, MD  Hey, Do you do anorectal manometry? This patient's symptoms (which she explained in more detail today before her colonoscopy) really sound like pelvic floor dyssynergy.

## 2020-01-03 NOTE — Telephone Encounter (Signed)
The pt has been scheduled for an anorectal manometry on 10/8. Left message on machine to call back - instructions sent to the pt My Chart  Dr Ardis Hughs this is the first available.

## 2020-01-03 NOTE — Telephone Encounter (Signed)
Milus Banister, MD  Mauri Pole, MD; Timothy Lasso, RN [name], thanks.   Corey Laski,  Can you contact her to arrange anorectal manometry in the next few weeks? For constipation, irregular defecation. Thanks       Previous Messages

## 2020-01-04 ENCOUNTER — Telehealth: Payer: Self-pay

## 2020-01-04 NOTE — Telephone Encounter (Signed)
That date is fine.  She's been dealing with this issue for years.  Thanks

## 2020-01-04 NOTE — Telephone Encounter (Signed)
Left message on follow up call. 

## 2020-01-04 NOTE — Telephone Encounter (Signed)
  Follow up Call-  Call back number 01/02/2020  Post procedure Call Back phone  # [phone]  Permission to leave phone message Yes  Some recent data might be hidden     Patient questions:  Do you have a fever, pain , or abdominal swelling? No. Pain Score  0 *  Have you tolerated food without any problems? Yes.    Have you been able to return to your normal activities? Yes.    Do you have any questions about your discharge instructions: Diet   No. Medications  No. Follow up visit  No.  Do you have questions or concerns about your Care? No.  Actions: * If pain score is 4 or above: No action needed, pain <4.   1. Have you developed a fever since your procedure? no  2.   Have you had an respiratory symptoms (SOB or cough) since your procedure? no  3.   Have you tested positive for COVID 19 since your procedure? No   4.   Have you had any family members/close contacts diagnosed with the COVID 19 since your procedure?  No   If yes to any of these questions please route to Joylene John, RN and Erenest Rasher, RN

## 2020-01-04 NOTE — Telephone Encounter (Signed)
Left message on machine to call back  

## 2020-01-07 ENCOUNTER — Encounter: Payer: Self-pay | Admitting: Gastroenterology

## 2020-01-07 DIAGNOSIS — E039 Hypothyroidism, unspecified: Secondary | ICD-10-CM | POA: Diagnosis not present

## 2020-01-07 NOTE — Telephone Encounter (Signed)
I have tried to reach the pt on several occasions with no return call.  I have sent the instructions to the pt via My Chart. I did confirm that she does read her My Chart messages.

## 2020-03-18 ENCOUNTER — Inpatient Hospital Stay (HOSPITAL_COMMUNITY): Admission: RE | Admit: 2020-03-18 | Payer: BC Managed Care – PPO | Source: Ambulatory Visit

## 2020-03-20 NOTE — Progress Notes (Signed)
Left message on machine regarding anal manometry . She is scheduled at 1030 tomorrow and has not had a covid test. I left message that patient needed to go to [facility] in [place] today by 3:30 for the covid test to be able to have the anal manometry tomorrow. I left our number and [place] office number to call.

## 2020-03-20 NOTE — Progress Notes (Signed)
Talked with patient and she stated she was unable to to the anal manometry at this time. She said she lost her job and had no insurance. She stated she would call back to reschedule when she was able.

## 2020-03-21 ENCOUNTER — Ambulatory Visit (HOSPITAL_COMMUNITY): Admission: RE | Admit: 2020-03-21 | Payer: 59 | Source: Home / Self Care | Admitting: Gastroenterology

## 2020-03-21 ENCOUNTER — Encounter (HOSPITAL_COMMUNITY): Admission: RE | Payer: Self-pay | Source: Home / Self Care

## 2020-03-21 SURGERY — MANOMETRY, ANORECTAL
Anesthesia: Monitor Anesthesia Care

## 2020-04-29 ENCOUNTER — Encounter: Payer: Self-pay | Admitting: Internal Medicine

## 2020-04-30 NOTE — Addendum Note (Signed)
Addended by: Earnstine Regal on: 04/30/2020 01:23 PM   Modules accepted: Orders

## 2020-05-01 MED ORDER — ZOLPIDEM TARTRATE 10 MG PO TABS: 10.0000 mg | ORAL_TABLET | Freq: Every evening | ORAL | 2 refills | Status: DC | PRN

## 2020-05-01 NOTE — Addendum Note (Signed)
Addended by: Pricilla Holm A on: 05/01/2020 12:23 PM   Modules accepted: Orders

## 2020-06-09 ENCOUNTER — Other Ambulatory Visit: Payer: Self-pay | Admitting: Internal Medicine

## 2020-06-18 ENCOUNTER — Other Ambulatory Visit: Payer: Self-pay | Admitting: Internal Medicine

## 2020-07-09 ENCOUNTER — Other Ambulatory Visit: Payer: Self-pay | Admitting: Internal Medicine

## 2020-07-16 ENCOUNTER — Other Ambulatory Visit: Payer: Self-pay | Admitting: Internal Medicine

## 2020-07-25 ENCOUNTER — Other Ambulatory Visit: Payer: Self-pay | Admitting: Internal Medicine

## 2020-07-28 ENCOUNTER — Other Ambulatory Visit: Payer: Self-pay | Admitting: Internal Medicine

## 2020-08-07 ENCOUNTER — Ambulatory Visit (INDEPENDENT_AMBULATORY_CARE_PROVIDER_SITE_OTHER): Payer: 59 | Admitting: Internal Medicine

## 2020-08-07 ENCOUNTER — Encounter: Payer: Self-pay | Admitting: Internal Medicine

## 2020-08-07 ENCOUNTER — Other Ambulatory Visit: Payer: Self-pay

## 2020-08-07 VITALS — BP 136/80 | HR 59 | Temp 98.3°F | Resp 18 | Ht 67.0 in | Wt 219.0 lb

## 2020-08-07 DIAGNOSIS — F321 Major depressive disorder, single episode, moderate: Secondary | ICD-10-CM

## 2020-08-07 DIAGNOSIS — R7301 Impaired fasting glucose: Secondary | ICD-10-CM

## 2020-08-07 DIAGNOSIS — R7401 Elevation of levels of liver transaminase levels: Secondary | ICD-10-CM | POA: Diagnosis not present

## 2020-08-07 DIAGNOSIS — E039 Hypothyroidism, unspecified: Secondary | ICD-10-CM

## 2020-08-07 LAB — COMPREHENSIVE METABOLIC PANEL
ALT: 47 U/L — ABNORMAL HIGH (ref 0–35)
AST: 20 U/L (ref 0–37)
Albumin: 3.8 g/dL (ref 3.5–5.2)
Alkaline Phosphatase: 113 U/L (ref 39–117)
BUN: 19 mg/dL (ref 6–23)
CO2: 31 mEq/L (ref 19–32)
Calcium: 9.7 mg/dL (ref 8.4–10.5)
Chloride: 102 mEq/L (ref 96–112)
Creatinine, Ser: 1.25 mg/dL — ABNORMAL HIGH (ref 0.40–1.20)
GFR: 46.83 mL/min — ABNORMAL LOW (ref >60.00)
Glucose, Bld: 95 mg/dL (ref 70–99)
Potassium: 4.5 mEq/L (ref 3.5–5.1)
Sodium: 139 mEq/L (ref 135–145)
Total Bilirubin: 0.4 mg/dL (ref 0.2–1.2)
Total Protein: 6.3 g/dL (ref 6.0–8.3)

## 2020-08-07 LAB — TSH: TSH: 5.67 u[IU]/mL — ABNORMAL HIGH (ref 0.35–4.50)

## 2020-08-07 LAB — CBC
HCT: 35.8 % — ABNORMAL LOW (ref 36.0–46.0)
Hemoglobin: 12.2 g/dL (ref 12.0–15.0)
MCHC: 34.2 g/dL (ref 30.0–36.0)
MCV: 89.9 fl (ref 78.0–100.0)
Platelets: 255 10*3/uL (ref 150.0–400.0)
RBC: 3.98 Mil/uL (ref 3.87–5.11)
RDW: 13.4 % (ref 11.5–15.5)
WBC: 8.1 10*3/uL (ref 4.0–10.5)

## 2020-08-07 LAB — T3, FREE: T3, Free: 3 pg/mL (ref 2.3–4.2)

## 2020-08-07 LAB — LIPID PANEL
Cholesterol: 245 mg/dL — ABNORMAL HIGH (ref 0–200)
HDL: 43 mg/dL (ref >39.00)
NonHDL: 201.68
Total CHOL/HDL Ratio: 6
Triglycerides: 292 mg/dL — ABNORMAL HIGH (ref 0.0–149.0)
VLDL: 58.4 mg/dL — ABNORMAL HIGH (ref 0.0–40.0)

## 2020-08-07 LAB — LDL CHOLESTEROL, DIRECT: Direct LDL: 151 mg/dL

## 2020-08-07 LAB — HEMOGLOBIN A1C: Hgb A1c MFr Bld: 6.2 % (ref 4.6–6.5)

## 2020-08-07 LAB — T4, FREE: Free T4: 0.9 ng/dL (ref 0.60–1.60)

## 2020-08-07 MED ORDER — ESCITALOPRAM OXALATE 10 MG PO TABS: 10.0000 mg | ORAL_TABLET | Freq: Every day | ORAL | 3 refills | Status: DC

## 2020-08-07 MED ORDER — AMOXICILLIN-POT CLAVULANATE 875-125 MG PO TABS: 1.0000 | ORAL_TABLET | Freq: Two times a day (BID) | ORAL | 0 refills | Status: DC

## 2020-08-07 NOTE — Progress Notes (Unsigned)
   Subjective:   Patient ID: Kristen Rowe, female    DOB: 1961-02-06, 60 y.o.   MRN: [id number]  HPI The patient is a 60 YO female coming in for concerns about her mental health. She did run out of wellbutrin about 1 month ago due to not getting refills called in. She did feel that was going okay but then started having withdrawal symptoms about 1 week out from stopping. This was changes in mood, muscle pains, fatigue. The fatigue is improving slightly. She is having less muscle pains. She is still having irritability. She is still taking celexa 20 mg daily without missing. She feels something else is needed and not sure celexa is helping anymore. She feels she has more anxiety than depression.   Taking levothyroxine 75 mcg daily (1/2 pill) for some time. If levels are good would like rx with accurate mcg to what she is taking. Labs but some time ago with endo.   Review of Systems  Constitutional: Positive for fatigue.  HENT: Negative.   Eyes: Negative.   Respiratory: Negative for cough, chest tightness and shortness of breath.   Cardiovascular: Negative for chest pain, palpitations and leg swelling.  Gastrointestinal: Negative for abdominal distention, abdominal pain, constipation, diarrhea, nausea and vomiting.  Musculoskeletal: Positive for myalgias.  Skin: Negative.   Neurological: Negative.   Psychiatric/Behavioral: Positive for dysphoric mood. The patient is nervous/anxious.     Objective:  Physical Exam Constitutional:      Appearance: She is well-developed and well-nourished.  HENT:     Head: Normocephalic and atraumatic.  Eyes:     Extraocular Movements: EOM normal.  Cardiovascular:     Rate and Rhythm: Normal rate and regular rhythm.  Pulmonary:     Effort: Pulmonary effort is normal. No respiratory distress.     Breath sounds: Normal breath sounds. No wheezing or rales.  Abdominal:     General: Bowel sounds are normal. There is no distension.     Palpations: Abdomen is  soft.     Tenderness: There is no abdominal tenderness. There is no rebound.  Musculoskeletal:        General: No edema.     Cervical back: Normal range of motion.  Skin:    General: Skin is warm and dry.  Neurological:     Mental Status: She is alert and oriented to person, place, and time.     Coordination: Coordination normal.  Psychiatric:        Mood and Affect: Mood and affect normal.     Vitals:   08/07/20 1308  BP: 136/80  Pulse: (!) 59  Resp: 18  Temp: 98.3 F (36.8 C)  TempSrc: Oral  SpO2: 95%  Weight: 219 lb (99.3 kg)  Height: 5\' 7"  (1.702 m)    This visit occurred during the SARS-CoV-2 public health emergency.  Safety protocols were in place, including screening questions prior to the visit, additional usage of staff PPE, and extensive cleaning of exam room while observing appropriate contact time as indicated for disinfecting solutions.   Assessment & Plan:

## 2020-08-07 NOTE — Patient Instructions (Addendum)
We have sent in lexapro (escitalopram) to take 10 mg (1 pill) daily. Once you start taking this, start taking 1/2 citalopram for 2 weeks then stop.   We are checking the labs and if thyroid is good we will send in 75 mcg daily.

## 2020-08-08 ENCOUNTER — Encounter: Payer: Self-pay | Admitting: Internal Medicine

## 2020-08-08 NOTE — Assessment & Plan Note (Signed)
We will start lexapro 10 mg daily. Keep off wellbutrin for now. We will wean celexa to off also to simplify regimen by taking 10 mg daily for 2 weeks then stop. Advised to reach out to use if she is running out of medication directly so that this does not happen in the future.

## 2020-08-08 NOTE — Assessment & Plan Note (Signed)
Checking TSH and free T4 and T3 today and adjust as needed. She is taking 1/2 pill of 150 mcg synthroid currently and will need rx accordingly. If levels normal 75 mcg daily will be appropriate.

## 2020-08-08 NOTE — Assessment & Plan Note (Signed)
Needs follow up HgA1c.

## 2020-08-11 ENCOUNTER — Other Ambulatory Visit: Payer: Self-pay | Admitting: Internal Medicine

## 2020-08-11 MED ORDER — LIVALO 2 MG PO TABS
1.0000 | ORAL_TABLET | Freq: Every day | ORAL | 3 refills | Status: DC
Start: 2020-08-11 — End: 2020-08-12

## 2020-08-11 MED ORDER — LEVOTHYROXINE SODIUM 75 MCG PO TABS: 75.0000 ug | ORAL_TABLET | Freq: Every day | ORAL | 3 refills | Status: DC

## 2020-08-12 ENCOUNTER — Encounter: Payer: Self-pay | Admitting: Internal Medicine

## 2020-08-12 MED ORDER — PRAVASTATIN SODIUM 40 MG PO TABS: 40.0000 mg | ORAL_TABLET | Freq: Every day | ORAL | 3 refills | Status: DC

## 2020-08-13 ENCOUNTER — Encounter: Payer: Self-pay | Admitting: Internal Medicine

## 2020-08-14 MED ORDER — ZOLPIDEM TARTRATE 10 MG PO TABS: 10.0000 mg | ORAL_TABLET | Freq: Every evening | ORAL | 5 refills | Status: DC | PRN

## 2020-08-20 ENCOUNTER — Other Ambulatory Visit: Payer: Self-pay | Admitting: Internal Medicine

## 2020-09-15 ENCOUNTER — Encounter: Payer: Self-pay | Admitting: Internal Medicine

## 2020-09-15 ENCOUNTER — Telehealth (INDEPENDENT_AMBULATORY_CARE_PROVIDER_SITE_OTHER): Payer: 59 | Admitting: Family

## 2020-09-15 DIAGNOSIS — J209 Acute bronchitis, unspecified: Secondary | ICD-10-CM | POA: Diagnosis not present

## 2020-09-15 MED ORDER — ALBUTEROL SULFATE HFA 108 (90 BASE) MCG/ACT IN AERS: 2.0000 | INHALATION_SPRAY | Freq: Four times a day (QID) | RESPIRATORY_TRACT | 0 refills | Status: DC | PRN

## 2020-09-15 MED ORDER — DOXYCYCLINE HYCLATE 100 MG PO TABS: 100.0000 mg | ORAL_TABLET | Freq: Two times a day (BID) | ORAL | 0 refills | Status: DC

## 2020-09-15 MED ORDER — PREDNISONE 20 MG PO TABS: 40.0000 mg | ORAL_TABLET | Freq: Every day | ORAL | 0 refills | Status: DC

## 2020-09-15 NOTE — Progress Notes (Signed)
Kristen Rowe is a 60 y.o. female with the following history as recorded in [employer]:  Patient Active Problem List   Diagnosis Date Noted  . Cough 08/11/2017  . Routine adult health maintenance 03/25/2017  . Impaired fasting blood sugar 10/29/2016  . Pes planus 09/13/2016  . Depression 04/27/2016  . Allergic rhinitis 04/27/2016  . Hypothyroidism 04/27/2016  . Elevated ALT measurement 04/27/2016    Current Outpatient Medications  Medication Sig Dispense Refill  . doxycycline (VIBRA-TABS) 100 MG tablet Take 1 tablet (100 mg total) by mouth 2 (two) times daily. 14 tablet 0  . predniSONE (DELTASONE) 20 MG tablet Take 2 tablets (40 mg total) by mouth daily with breakfast. 10 tablet 0  . albuterol (VENTOLIN HFA) 108 (90 Base) MCG/ACT inhaler Inhale 2 puffs into the lungs every 6 (six) hours as needed for wheezing or shortness of breath. 1 each 0  . escitalopram (LEXAPRO) 10 MG tablet Take 1 tablet (10 mg total) by mouth daily. 90 tablet 3  . levothyroxine (SYNTHROID) 75 MCG tablet Take 1 tablet (75 mcg total) by mouth daily. 90 tablet 3  . lisinopril-hydrochlorothiazide (ZESTORETIC) 20-12.5 MG tablet Take 1 tablet by mouth daily. 90 tablet 3  . metoprolol tartrate (LOPRESSOR) 50 MG tablet Take 1 tablet by mouth twice daily 180 tablet 0  . montelukast (SINGULAIR) 10 MG tablet Take 1 tablet (10 mg total) by mouth at bedtime. 90 tablet 3  . pravastatin (PRAVACHOL) 40 MG tablet Take 1 tablet (40 mg total) by mouth daily. 90 tablet 3  . zolpidem (AMBIEN) 10 MG tablet Take 1 tablet (10 mg total) by mouth at bedtime as needed. for sleep 30 tablet 5   No current facility-administered medications for this visit.    Allergies: Codeine  Past Medical History:  Diagnosis Date  . Allergy   . Anxiety   . Depression   . Hypertension   . Thyroid disease     Past Surgical History:  Procedure Laterality Date  . ANKLE SURGERY Right 02/10/2018  . APPENDECTOMY    . CHOLECYSTECTOMY    . HERNIA REPAIR   11/09/2019   with blokage    Family History  Problem Relation Age of Onset  . Cancer Mother        lung  . Heart disease Mother   . Hypertension Mother   . Heart disease Father   . Hypertension Father   . Hypertension Sister   . Hypertension Brother   . Colon cancer Neg Hx   . Esophageal cancer Neg Hx   . Rectal cancer Neg Hx   . Stomach cancer Neg Hx     Social History   Tobacco Use  . Smoking status: Former Smoker    Quit date: 04/26/2016    Years since quitting: 4.3  . Smokeless tobacco: Never Used  Substance Use Topics  . Alcohol use: No    Subjective:   I connected with Kristen Rowe on 09/15/20 at  3:00 PM EDT by a video enabled telemedicine application and verified that I am speaking with the correct person using two identifiers.   I discussed the limitations of evaluation and management by telemedicine and the availability of in person appointments. The patient expressed understanding and agreed to proceed. Provider in office/ patient is at home; provider and patient are only 2 people on video call.   Concerned for bronchitis; symptoms x 2 weeks- seemed to start after cleaning homes/ dust exposure; is prone to recurrent episodes of bronchitis; Started with sore  throat and has progressively worsened into cough; has tried using OTC Mucinex; has taken 2 different COVID tests- both negative;  Is currently working with a client in [place] and unable to be seen in person;     Objective:  There were no vitals filed for this visit.  General: Well developed, well nourished, in no acute distress  Head: Normocephalic and atraumatic  Lungs: Respirations unlabored;  Neurologic: Alert and oriented; speech intact; face symmetrical;   Assessment:  1. Acute bronchitis, unspecified organism     Plan:  Suspect allergic component; Rx for Doxycycline and albuterol; she will only plan to use prednisone if not improved in next 2-3 days; if symptoms persist or worsen, she will need to  be seen at [facility] in [place] where she will be for the next 2 weeks;  No follow-ups on file.  No orders of the defined types were placed in this encounter.   Requested Prescriptions   Signed Prescriptions Disp Refills  . doxycycline (VIBRA-TABS) 100 MG tablet 14 tablet 0    Sig: Take 1 tablet (100 mg total) by mouth 2 (two) times daily.  Marland Kitchen albuterol (VENTOLIN HFA) 108 (90 Base) MCG/ACT inhaler 1 each 0    Sig: Inhale 2 puffs into the lungs every 6 (six) hours as needed for wheezing or shortness of breath.  . predniSONE (DELTASONE) 20 MG tablet 10 tablet 0    Sig: Take 2 tablets (40 mg total) by mouth daily with breakfast.

## 2020-10-06 ENCOUNTER — Encounter: Payer: Self-pay | Admitting: Internal Medicine

## 2020-10-25 ENCOUNTER — Encounter: Payer: Self-pay | Admitting: Internal Medicine

## 2020-10-27 MED ORDER — BUPROPION HCL ER (XL) 150 MG PO TB24: 150.0000 mg | ORAL_TABLET | Freq: Every day | ORAL | 1 refills | Status: DC

## 2020-10-27 NOTE — Addendum Note (Signed)
Addended by: Pricilla Holm A on: 10/27/2020 02:44 PM   Modules accepted: Orders

## 2020-10-31 ENCOUNTER — Other Ambulatory Visit: Payer: Self-pay | Admitting: Internal Medicine

## 2020-12-16 ENCOUNTER — Encounter: Payer: Self-pay | Admitting: Internal Medicine

## 2020-12-19 ENCOUNTER — Other Ambulatory Visit: Payer: Self-pay

## 2020-12-19 MED ORDER — LISINOPRIL-HYDROCHLOROTHIAZIDE 20-12.5 MG PO TABS: 1.0000 | ORAL_TABLET | Freq: Every day | ORAL | 3 refills | Status: DC

## 2021-01-30 ENCOUNTER — Other Ambulatory Visit: Payer: Self-pay | Admitting: Internal Medicine

## 2021-02-02 ENCOUNTER — Encounter: Payer: Self-pay | Admitting: Internal Medicine

## 2021-03-04 ENCOUNTER — Other Ambulatory Visit: Payer: Self-pay | Admitting: Internal Medicine

## 2021-03-09 ENCOUNTER — Encounter: Payer: Self-pay | Admitting: Internal Medicine

## 2021-03-13 ENCOUNTER — Encounter: Payer: Self-pay | Admitting: Internal Medicine

## 2021-03-15 ENCOUNTER — Other Ambulatory Visit: Payer: Self-pay | Admitting: Internal Medicine

## 2021-03-15 MED ORDER — ZOLPIDEM TARTRATE 10 MG PO TABS: 10.0000 mg | ORAL_TABLET | Freq: Every evening | ORAL | 0 refills | Status: DC | PRN

## 2021-05-02 ENCOUNTER — Other Ambulatory Visit: Payer: Self-pay | Admitting: Internal Medicine

## 2021-05-10 ENCOUNTER — Other Ambulatory Visit: Payer: Self-pay | Admitting: Internal Medicine

## 2021-06-02 ENCOUNTER — Encounter: Payer: Self-pay | Admitting: Family Medicine

## 2021-06-02 ENCOUNTER — Telehealth (INDEPENDENT_AMBULATORY_CARE_PROVIDER_SITE_OTHER): Payer: 59 | Admitting: Family Medicine

## 2021-06-02 DIAGNOSIS — R0981 Nasal congestion: Secondary | ICD-10-CM

## 2021-06-02 DIAGNOSIS — R059 Cough, unspecified: Secondary | ICD-10-CM | POA: Diagnosis not present

## 2021-06-02 MED ORDER — ALBUTEROL SULFATE HFA 108 (90 BASE) MCG/ACT IN AERS: 2.0000 | INHALATION_SPRAY | Freq: Four times a day (QID) | RESPIRATORY_TRACT | 0 refills | Status: DC | PRN

## 2021-06-02 MED ORDER — BENZONATATE 100 MG PO CAPS: ORAL_CAPSULE | ORAL | 0 refills | Status: DC

## 2021-06-02 MED ORDER — DOXYCYCLINE HYCLATE 100 MG PO TABS: 100.0000 mg | ORAL_TABLET | Freq: Two times a day (BID) | ORAL | 0 refills | Status: DC

## 2021-06-02 NOTE — Patient Instructions (Signed)
-  I sent the medication(s) we discussed to your pharmacy: Meds ordered this encounter  Medications   albuterol (VENTOLIN HFA) 108 (90 Base) MCG/ACT inhaler    Sig: Inhale 2 puffs into the lungs every 6 (six) hours as needed for wheezing or shortness of breath.    Dispense:  1 each    Refill:  0   doxycycline (VIBRA-TABS) 100 MG tablet    Sig: Take 1 tablet (100 mg total) by mouth 2 (two) times daily.    Dispense:  20 tablet    Refill:  0   benzonatate (TESSALON PERLES) 100 MG capsule    Sig: 1-2 capsules up to twice daily as needed for cough    Dispense:  30 capsule    Refill:  0    I hope you are feeling better soon!  Seek in person care promptly if your symptoms worsen, new concerns arise or you are not improving with treatment.  It was nice to meet you today. I help [facility] out with telemedicine visits on Tuesdays and Thursdays and am happy to help if you need a virtual follow up visit on those days. Otherwise, if you have any concerns or questions following this visit please schedule a follow up visit with your Primary Care office or seek care at a local urgent care clinic to avoid delays in care

## 2021-06-02 NOTE — Progress Notes (Signed)
Virtual Visit via Telephone Note  I connected with Kristen Rowe on 06/02/21 at  6:00 PM EST by telephone and verified that I am speaking with the correct person using two identifiers.   I discussed the limitations, risks, security and privacy concerns of performing an evaluation and management service by telephone and the availability of in person appointments. I also discussed with the patient that there may be a patient responsible charge related to this service. The patient expressed understanding and agreed to proceed.  Location patient: home, [place] Location provider: work or home office Participants present for the call: patient, provider Patient did not have a visit with me in the prior 7 days to address this/these issue(s).   History of Present Illness:  Acute telemedicine visit for sinus congestion: -Onset: about 2+weeks -Symptoms include: nasal congestion, pnd, facial pain, sore throat, cough, chest tightness, some wheezing -needs refill on her albuterol as is out of that -negative covid tests -Denies:SOB, fever, NVD, inability to eat/drink/get out of bed -Pertinent past medical history: very prone to sinus infections, has had facial surgery; reports gets bronchitis when gets a sinus infection -Pertinent medication allergies: Allergies  Allergen Reactions   Codeine Itching  -COVID-19 vaccine status:  Immunization History  Administered Date(s) Administered   Hepatitis B, adult 10/28/2016   Influenza-Unspecified 03/28/2016, 04/14/2018, 03/15/2019, 05/12/2021   PFIZER(Purple Top)SARS-COV-2 Vaccination 01/21/2020, 02/13/2020   PPD Test 07/21/2020   Pfizer Covid-19 Vaccine Bivalent Booster 60yrs & up 04/21/2021   Tdap 06/14/2014    Past Medical History:  Diagnosis Date   Allergy    Anxiety    Depression    Hypertension    Thyroid disease       Observations/Objective: Patient sounds cheerful and well on the phone. I do not appreciate any SOB. Speech and thought  processing are grossly intact. Patient reported vitals: O2 95%, P 63, BP 174/87, temp 98.4  Assessment and Plan:  Nasal congestion  Cough, unspecified type  -we discussed possible serious and likely etiologies, options for evaluation and workup, limitations of telemedicine visit vs in person visit, treatment, treatment risks and precautions. Pt prefers to treat via telemedicine empirically rather than in person at this moment. Given duration of symptoms, worsening and history she wants to try empiric doxy. Also sent refill of her alb and Tessalon for cough.  Advised to seek prompt in person care if worsening, new symptoms arise, or if is not improving with treatment. Advised of options for inperson care in case PCP office not available. Did let the patient know that I only do telemedicine shifts for [place] on Tuesdays and Thursdays and advised a follow up visit with PCP or at an [facility] if has further questions or concerns.   Follow Up Instructions:  I did not refer this patient for an OV with me in the next 24 hours for this/these issue(s).  I discussed the assessment and treatment plan with the patient. The patient was provided an opportunity to ask questions and all were answered. The patient agreed with the plan and demonstrated an understanding of the instructions.   I spent 12 minutes on the date of this visit in the care of this patient. See summary of tasks completed to properly care for this patient in the detailed notes above which also included counseling of above, review of PMH, medications, allergies, evaluation of the patient and ordering and/or  instructing patient on testing and care options.     Lucretia Kern, DO

## 2021-07-28 ENCOUNTER — Other Ambulatory Visit: Payer: Self-pay | Admitting: Internal Medicine

## 2021-08-04 ENCOUNTER — Other Ambulatory Visit: Payer: Self-pay | Admitting: Internal Medicine

## 2021-08-31 ENCOUNTER — Other Ambulatory Visit: Payer: Self-pay

## 2021-08-31 ENCOUNTER — Encounter: Payer: Self-pay | Admitting: Internal Medicine

## 2021-08-31 ENCOUNTER — Ambulatory Visit (INDEPENDENT_AMBULATORY_CARE_PROVIDER_SITE_OTHER): Payer: BC Managed Care – PPO | Admitting: Internal Medicine

## 2021-08-31 VITALS — BP 130/78 | HR 65 | Resp 18 | Ht 67.0 in | Wt 229.6 lb

## 2021-08-31 DIAGNOSIS — R7301 Impaired fasting glucose: Secondary | ICD-10-CM | POA: Diagnosis not present

## 2021-08-31 DIAGNOSIS — I10 Essential (primary) hypertension: Secondary | ICD-10-CM

## 2021-08-31 DIAGNOSIS — Z0000 Encounter for general adult medical examination without abnormal findings: Secondary | ICD-10-CM

## 2021-08-31 DIAGNOSIS — F331 Major depressive disorder, recurrent, moderate: Secondary | ICD-10-CM

## 2021-08-31 DIAGNOSIS — F5101 Primary insomnia: Secondary | ICD-10-CM | POA: Diagnosis not present

## 2021-08-31 DIAGNOSIS — J0110 Acute frontal sinusitis, unspecified: Secondary | ICD-10-CM

## 2021-08-31 DIAGNOSIS — R7401 Elevation of levels of liver transaminase levels: Secondary | ICD-10-CM

## 2021-08-31 DIAGNOSIS — E039 Hypothyroidism, unspecified: Secondary | ICD-10-CM | POA: Diagnosis not present

## 2021-08-31 LAB — LDL CHOLESTEROL, DIRECT: Direct LDL: 173 mg/dL

## 2021-08-31 LAB — COMPREHENSIVE METABOLIC PANEL
ALT: 69 U/L — ABNORMAL HIGH (ref 0–35)
AST: 39 U/L — ABNORMAL HIGH (ref 0–37)
Albumin: 4.3 g/dL (ref 3.5–5.2)
Alkaline Phosphatase: 127 U/L — ABNORMAL HIGH (ref 39–117)
BUN: 16 mg/dL (ref 6–23)
CO2: 31 mEq/L (ref 19–32)
Calcium: 9.9 mg/dL (ref 8.4–10.5)
Chloride: 100 mEq/L (ref 96–112)
Creatinine, Ser: 1.04 mg/dL (ref 0.40–1.20)
GFR: 57.96 mL/min — ABNORMAL LOW (ref >60.00)
Glucose, Bld: 120 mg/dL — ABNORMAL HIGH (ref 70–99)
Potassium: 3.9 mEq/L (ref 3.5–5.1)
Sodium: 138 mEq/L (ref 135–145)
Total Bilirubin: 0.4 mg/dL (ref 0.2–1.2)
Total Protein: 7.2 g/dL (ref 6.0–8.3)

## 2021-08-31 LAB — TSH: TSH: 4.57 u[IU]/mL (ref 0.35–5.50)

## 2021-08-31 LAB — LIPID PANEL
Cholesterol: 306 mg/dL — ABNORMAL HIGH (ref 0–200)
HDL: 43.2 mg/dL (ref >39.00)
Total CHOL/HDL Ratio: 7
Triglycerides: 548 mg/dL — ABNORMAL HIGH (ref 0.0–149.0)

## 2021-08-31 LAB — CBC
HCT: 35.1 % — ABNORMAL LOW (ref 36.0–46.0)
Hemoglobin: 12.1 g/dL (ref 12.0–15.0)
MCHC: 34.4 g/dL (ref 30.0–36.0)
MCV: 91.2 fl (ref 78.0–100.0)
Platelets: 234 10*3/uL (ref 150.0–400.0)
RBC: 3.85 Mil/uL — ABNORMAL LOW (ref 3.87–5.11)
RDW: 12.8 % (ref 11.5–15.5)
WBC: 9 10*3/uL (ref 4.0–10.5)

## 2021-08-31 LAB — HEMOGLOBIN A1C: Hgb A1c MFr Bld: 6.7 % — ABNORMAL HIGH (ref 4.6–6.5)

## 2021-08-31 MED ORDER — DOXYCYCLINE HYCLATE 100 MG PO TABS: 100.0000 mg | ORAL_TABLET | Freq: Two times a day (BID) | ORAL | 0 refills | Status: AC

## 2021-08-31 MED ORDER — LEVOTHYROXINE SODIUM 75 MCG PO TABS: 75.0000 ug | ORAL_TABLET | Freq: Every day | ORAL | 3 refills | Status: DC

## 2021-08-31 MED ORDER — ESCITALOPRAM OXALATE 20 MG PO TABS: 20.0000 mg | ORAL_TABLET | Freq: Every day | ORAL | 3 refills | Status: DC

## 2021-08-31 MED ORDER — BUPROPION HCL 75 MG PO TABS: 75.0000 mg | ORAL_TABLET | Freq: Every day | ORAL | 3 refills | Status: DC

## 2021-08-31 MED ORDER — LISINOPRIL-HYDROCHLOROTHIAZIDE 20-25 MG PO TABS: 1.0000 | ORAL_TABLET | Freq: Every day | ORAL | 3 refills | Status: DC

## 2021-08-31 NOTE — Progress Notes (Signed)
? ?  Subjective:  ? ?Patient ID: Kristen Rowe, female    DOB: August 28, 1960, 61 y.o.   MRN: [id number] ? ?HPI ?The patient is here for physical. With new concerns. ? ?PMH, [facility], social history reviewed and updated ? ?Review of Systems  ?Constitutional: Negative.  Negative for fatigue, fever and unexpected weight change.  ?HENT:  Positive for congestion, postnasal drip, rhinorrhea and sinus pressure. Negative for ear discharge, ear pain, sinus pain, sneezing, sore throat, tinnitus, trouble swallowing and voice change.   ?Eyes: Negative.   ?Respiratory:  Positive for cough. Negative for chest tightness, shortness of breath and wheezing.   ?Cardiovascular: Negative.  Negative for chest pain, palpitations and leg swelling.  ?Gastrointestinal: Negative.  Negative for abdominal distention, abdominal pain, constipation, diarrhea, nausea and vomiting.  ?Musculoskeletal: Negative.   ?Skin: Negative.   ?Neurological: Negative.   ?Psychiatric/Behavioral: Negative.    ? ?Objective:  ?Physical Exam ?Constitutional:   ?   Appearance: She is well-developed.  ?HENT:  ?   Head: Normocephalic and atraumatic.  ?   Comments: Oropharynx with redness and clear drainage, nose with swollen turbinates, TMs normal bilaterally.  ?   Ears:  ?   Comments: Frontal sinus tenderness ?Neck:  ?   Thyroid: No thyromegaly.  ?Cardiovascular:  ?   Rate and Rhythm: Normal rate and regular rhythm.  ?Pulmonary:  ?   Effort: Pulmonary effort is normal. No respiratory distress.  ?   Breath sounds: Normal breath sounds. No wheezing or rales.  ?Abdominal:  ?   General: Bowel sounds are normal. There is no distension.  ?   Palpations: Abdomen is soft.  ?   Tenderness: There is no abdominal tenderness. There is no rebound.  ?Musculoskeletal:     ?   General: No tenderness.  ?   Cervical back: Normal range of motion.  ?Lymphadenopathy:  ?   Cervical: No cervical adenopathy.  ?Skin: ?   General: Skin is warm and dry.  ?Neurological:  ?   Mental Status: She is alert and  oriented to person, place, and time.  ?   Coordination: Coordination normal.  ? ? ?Vitals:  ? 08/31/21 1419  ?BP: 130/78  ?Pulse: 65  ?Resp: 18  ?SpO2: 97%  ?Weight: 229 lb 9.6 oz (104.1 kg)  ?Height: '5\' 7"'$  (1.702 m)  ? ?EKG: Rate 71, axis normal, interval normal, sinus, no st or t wave changes, no prior to compare ? ?This visit occurred during the SARS-CoV-2 public health emergency.  Safety protocols were in place, including screening questions prior to the visit, additional usage of staff PPE, and extensive cleaning of exam room while observing appropriate contact time as indicated for disinfecting solutions.  ? ?Assessment & Plan:  ? ?

## 2021-08-31 NOTE — Patient Instructions (Addendum)
EKG looks good.  ? ?We have sent in lower dose wellbutrin and increased dose lexapro and the increased dose of lisinopril/hctz ? ?We have sent in 2 weeks of doxycycline. ?

## 2021-09-01 ENCOUNTER — Encounter: Payer: Self-pay | Admitting: Internal Medicine

## 2021-09-01 ENCOUNTER — Other Ambulatory Visit: Payer: Self-pay

## 2021-09-01 MED ORDER — METOPROLOL TARTRATE 50 MG PO TABS: 50.0000 mg | ORAL_TABLET | Freq: Two times a day (BID) | ORAL | 1 refills | Status: DC

## 2021-09-03 ENCOUNTER — Encounter: Payer: Self-pay | Admitting: Internal Medicine

## 2021-09-03 ENCOUNTER — Other Ambulatory Visit: Payer: Self-pay | Admitting: Internal Medicine

## 2021-09-03 DIAGNOSIS — G4700 Insomnia, unspecified: Secondary | ICD-10-CM | POA: Insufficient documentation

## 2021-09-03 MED ORDER — PITAVASTATIN CALCIUM 2 MG PO TABS: 2.0000 mg | ORAL_TABLET | Freq: Every day | ORAL | 3 refills | Status: DC

## 2021-09-03 NOTE — Assessment & Plan Note (Signed)
Flu shot declines. Covid-19 counseled. Shingrix declines. Tetanus up to date. Colonoscopy up to date. Mammogram declines, pap smear declines today. Counseled about sun safety and mole surveillance. Counseled about the dangers of distracted driving. Given 10 year screening recommendations.  ? ?

## 2021-09-03 NOTE — Assessment & Plan Note (Signed)
With worsening. Add wellbutrin 75 mg daily which is prescribed and increase lexapro 20 mg daily which is an increase in dose today to help with worsening symptoms. She will check in 1-2 months to assess clinical condition and need for further changes to regimen. ?

## 2021-09-03 NOTE — Assessment & Plan Note (Signed)
Checking CMP and lipid panel. BP borderline today and elevated in the evening at home. Taking metoprolol 50 mg BID and will continue. Increase lisinopril/hctz to 20/25 mg daily from 20/12.5 mg daily to help with control. She has ability to monitor at home and will let us know how change is impacting BP. ?

## 2021-09-03 NOTE — Assessment & Plan Note (Signed)
Uses ambien 10 mg qhs for sleep. No side effects. Reminded about potential hazards of treatment and she wishes to continue. ?

## 2021-09-03 NOTE — Assessment & Plan Note (Signed)
Rx doxycycline 2 week course for new sinusitis frontal.  ?

## 2021-09-03 NOTE — Assessment & Plan Note (Signed)
Checking TSH and adjust synthroid 75 mcg daily as needed. No symptoms of over or under replacement today. Continue synthroid 75 mcg daily.  ?

## 2021-09-03 NOTE — Assessment & Plan Note (Signed)
Checking CMP to check for stability.  

## 2021-09-03 NOTE — Assessment & Plan Note (Signed)
Checking HgA1c today and adjust as needed. Reinforced need for dietary changes.  ?

## 2021-09-14 DIAGNOSIS — M7632 Iliotibial band syndrome, left leg: Secondary | ICD-10-CM | POA: Diagnosis not present

## 2021-09-14 DIAGNOSIS — M25562 Pain in left knee: Secondary | ICD-10-CM | POA: Diagnosis not present

## 2021-10-29 ENCOUNTER — Other Ambulatory Visit: Payer: Self-pay | Admitting: Internal Medicine

## 2021-11-24 ENCOUNTER — Other Ambulatory Visit: Payer: Self-pay | Admitting: Family Medicine

## 2022-02-12 ENCOUNTER — Other Ambulatory Visit: Payer: Self-pay | Admitting: Internal Medicine

## 2022-02-15 ENCOUNTER — Encounter: Payer: Self-pay | Admitting: Internal Medicine

## 2022-02-17 MED ORDER — ALBUTEROL SULFATE HFA 108 (90 BASE) MCG/ACT IN AERS: 2.0000 | INHALATION_SPRAY | Freq: Four times a day (QID) | RESPIRATORY_TRACT | 0 refills | Status: DC | PRN

## 2022-04-03 ENCOUNTER — Other Ambulatory Visit: Payer: Self-pay | Admitting: Internal Medicine

## 2022-04-05 NOTE — Telephone Encounter (Signed)
MD out of the office until Wednesday pls advise../l mb

## 2022-08-15 ENCOUNTER — Other Ambulatory Visit: Payer: Self-pay | Admitting: Internal Medicine

## 2022-08-27 ENCOUNTER — Other Ambulatory Visit: Payer: Self-pay | Admitting: Internal Medicine

## 2022-09-02 ENCOUNTER — Ambulatory Visit (INDEPENDENT_AMBULATORY_CARE_PROVIDER_SITE_OTHER): Payer: BLUE CROSS/BLUE SHIELD | Admitting: Internal Medicine

## 2022-09-02 ENCOUNTER — Encounter: Payer: Self-pay | Admitting: Internal Medicine

## 2022-09-02 VITALS — BP 130/82 | HR 52 | Temp 97.8°F | Ht 67.0 in | Wt 229.0 lb

## 2022-09-02 DIAGNOSIS — I10 Essential (primary) hypertension: Secondary | ICD-10-CM | POA: Diagnosis not present

## 2022-09-02 DIAGNOSIS — E039 Hypothyroidism, unspecified: Secondary | ICD-10-CM

## 2022-09-02 DIAGNOSIS — Z0000 Encounter for general adult medical examination without abnormal findings: Secondary | ICD-10-CM

## 2022-09-02 DIAGNOSIS — R7301 Impaired fasting glucose: Secondary | ICD-10-CM | POA: Diagnosis not present

## 2022-09-02 DIAGNOSIS — F5101 Primary insomnia: Secondary | ICD-10-CM

## 2022-09-02 DIAGNOSIS — F331 Major depressive disorder, recurrent, moderate: Secondary | ICD-10-CM

## 2022-09-02 LAB — COMPREHENSIVE METABOLIC PANEL
ALT: 51 U/L — ABNORMAL HIGH (ref 0–35)
AST: 31 U/L (ref 0–37)
Albumin: 4.2 g/dL (ref 3.5–5.2)
Alkaline Phosphatase: 144 U/L — ABNORMAL HIGH (ref 39–117)
BUN: 22 mg/dL (ref 6–23)
CO2: 30 mEq/L (ref 19–32)
Calcium: 9.6 mg/dL (ref 8.4–10.5)
Chloride: 96 mEq/L (ref 96–112)
Creatinine, Ser: 1.33 mg/dL — ABNORMAL HIGH (ref 0.40–1.20)
GFR: 42.85 mL/min — ABNORMAL LOW (ref >60.00)
Glucose, Bld: 117 mg/dL — ABNORMAL HIGH (ref 70–99)
Potassium: 4 mEq/L (ref 3.5–5.1)
Sodium: 134 mEq/L — ABNORMAL LOW (ref 135–145)
Total Bilirubin: 0.4 mg/dL (ref 0.2–1.2)
Total Protein: 7.4 g/dL (ref 6.0–8.3)

## 2022-09-02 LAB — LIPID PANEL
Cholesterol: 258 mg/dL — ABNORMAL HIGH (ref 0–200)
HDL: 37 mg/dL — ABNORMAL LOW (ref >39.00)
NonHDL: 221.07
Total CHOL/HDL Ratio: 7
Triglycerides: 345 mg/dL — ABNORMAL HIGH (ref 0.0–149.0)
VLDL: 69 mg/dL — ABNORMAL HIGH (ref 0.0–40.0)

## 2022-09-02 LAB — MICROALBUMIN / CREATININE URINE RATIO
Creatinine,U: 30.9 mg/dL
Microalb Creat Ratio: 2.3 mg/g (ref 0.0–30.0)
Microalb, Ur: <0.7 mg/dL (ref 0.0–1.9)

## 2022-09-02 LAB — TSH: TSH: 8.7 u[IU]/mL — ABNORMAL HIGH (ref 0.35–5.50)

## 2022-09-02 LAB — CBC
HCT: 36.7 % (ref 36.0–46.0)
Hemoglobin: 12.6 g/dL (ref 12.0–15.0)
MCHC: 34.3 g/dL (ref 30.0–36.0)
MCV: 91.3 fl (ref 78.0–100.0)
Platelets: 295 10*3/uL (ref 150.0–400.0)
RBC: 4.02 Mil/uL (ref 3.87–5.11)
RDW: 12.7 % (ref 11.5–15.5)
WBC: 8.8 10*3/uL (ref 4.0–10.5)

## 2022-09-02 LAB — LDL CHOLESTEROL, DIRECT: Direct LDL: 164 mg/dL

## 2022-09-02 LAB — HEMOGLOBIN A1C: Hgb A1c MFr Bld: 6.7 % — ABNORMAL HIGH (ref 4.6–6.5)

## 2022-09-02 MED ORDER — ALBUTEROL SULFATE HFA 108 (90 BASE) MCG/ACT IN AERS: 2.0000 | INHALATION_SPRAY | Freq: Four times a day (QID) | RESPIRATORY_TRACT | 0 refills | Status: DC | PRN

## 2022-09-02 MED ORDER — LEVOTHYROXINE SODIUM 75 MCG PO TABS: 75.0000 ug | ORAL_TABLET | Freq: Every day | ORAL | 3 refills | Status: DC

## 2022-09-02 MED ORDER — ESCITALOPRAM OXALATE 20 MG PO TABS: 30.0000 mg | ORAL_TABLET | Freq: Every day | ORAL | 3 refills | Status: DC

## 2022-09-02 MED ORDER — LISINOPRIL-HYDROCHLOROTHIAZIDE 20-25 MG PO TABS: 1.0000 | ORAL_TABLET | Freq: Every day | ORAL | 3 refills | Status: AC

## 2022-09-02 MED ORDER — BUPROPION HCL 75 MG PO TABS: 75.0000 mg | ORAL_TABLET | Freq: Every day | ORAL | 3 refills | Status: AC

## 2022-09-02 MED ORDER — METOPROLOL TARTRATE 50 MG PO TABS: 50.0000 mg | ORAL_TABLET | Freq: Two times a day (BID) | ORAL | 3 refills | Status: DC

## 2022-09-02 NOTE — Progress Notes (Signed)
   Subjective:   Patient ID: Kristen Rowe, female    DOB: 1961-02-05, 62 y.o.   MRN: [id number]  HPI The patient is here for physical.  PMH, [facility], social history reviewed and updated  Review of Systems  Constitutional: Negative.   HENT: Negative.    Eyes: Negative.   Respiratory:  Negative for cough, chest tightness and shortness of breath.   Cardiovascular:  Negative for chest pain, palpitations and leg swelling.  Gastrointestinal:  Negative for abdominal distention, abdominal pain, constipation, diarrhea, nausea and vomiting.  Musculoskeletal: Negative.   Skin: Negative.   Neurological: Negative.   Psychiatric/Behavioral: Negative.      Objective:  Physical Exam Exam conducted with a chaperone present.  Constitutional:      Appearance: She is well-developed.  HENT:     Head: Normocephalic and atraumatic.  Cardiovascular:     Rate and Rhythm: Normal rate and regular rhythm.  Pulmonary:     Effort: Pulmonary effort is normal. No respiratory distress.     Breath sounds: Normal breath sounds. No wheezing or rales.  Abdominal:     General: Bowel sounds are normal. There is no distension.     Palpations: Abdomen is soft.     Tenderness: There is no abdominal tenderness. There is no rebound.  Genitourinary:    Vagina: No vaginal discharge.     Comments: Pap smear collected Musculoskeletal:     Cervical back: Normal range of motion.  Skin:    General: Skin is warm and dry.  Neurological:     Mental Status: She is alert and oriented to person, place, and time.     Coordination: Coordination normal.     Vitals:   09/02/22 1023  BP: 130/82  Pulse: (!) 52  Temp: 97.8 F (36.6 C)  TempSrc: Oral  Weight: 229 lb (103.9 kg)  Height: 5\' 7"  (1.702 m)    Assessment & Plan:

## 2022-09-03 ENCOUNTER — Encounter: Payer: Self-pay | Admitting: Internal Medicine

## 2022-09-03 NOTE — Assessment & Plan Note (Signed)
BP at goal on lisinopril/hctz and metoprolol 50 mg BID. Checking CMP and adjust as needed.

## 2022-09-03 NOTE — Assessment & Plan Note (Signed)
Flu shot up to date. Shingrix counseled due. Tetanus up to date. Colonoscopy due this year. Mammogram overdue declines, pap smear done at visit. Counseled about sun safety and mole surveillance. Counseled about the dangers of distracted driving. Given 10 year screening recommendations.

## 2022-09-03 NOTE — Assessment & Plan Note (Signed)
Using ambien 10 mg qhs with success and refilled.

## 2022-09-03 NOTE — Assessment & Plan Note (Signed)
Checking HgA1c and adjust as needed.  

## 2022-09-03 NOTE — Assessment & Plan Note (Signed)
Checking TSH and adjust as needed synthroid 75 mcg daily.

## 2022-09-03 NOTE — Assessment & Plan Note (Signed)
Doing well with lexapro 30 mg daily and wellbutrin 75 mg daily and will rx. She has taken 1.5 lexapro due to increasing life stress for >4 weeks now with improvement in symptoms.

## 2022-09-07 ENCOUNTER — Other Ambulatory Visit: Payer: Self-pay | Admitting: Internal Medicine

## 2022-09-08 ENCOUNTER — Other Ambulatory Visit: Payer: Self-pay | Admitting: Internal Medicine

## 2022-09-08 DIAGNOSIS — E039 Hypothyroidism, unspecified: Secondary | ICD-10-CM

## 2022-09-08 MED ORDER — LEVOTHYROXINE SODIUM 100 MCG PO TABS: 100.0000 ug | ORAL_TABLET | Freq: Every day | ORAL | 3 refills | Status: AC

## 2022-09-09 LAB — CYTOLOGY - PAP: Adequacy: ABNORMAL

## 2022-09-10 ENCOUNTER — Encounter: Payer: Self-pay | Admitting: Internal Medicine

## 2022-09-13 ENCOUNTER — Other Ambulatory Visit: Payer: Self-pay | Admitting: Internal Medicine

## 2022-09-13 MED ORDER — PITAVASTATIN CALCIUM 2 MG PO TABS: 2.0000 mg | ORAL_TABLET | Freq: Every day | ORAL | 3 refills | Status: AC

## 2022-09-23 ENCOUNTER — Other Ambulatory Visit: Payer: Self-pay | Admitting: Internal Medicine

## 2022-09-28 ENCOUNTER — Ambulatory Visit
Admission: EM | Admit: 2022-09-28 | Discharge: 2022-09-28 | Disposition: A | Discharge status: Home / Self Care | Payer: BLUE CROSS/BLUE SHIELD | Attending: Nurse Practitioner | Admitting: Nurse Practitioner

## 2022-09-28 DIAGNOSIS — H6993 Unspecified Eustachian tube disorder, bilateral: Secondary | ICD-10-CM

## 2022-09-28 MED ORDER — PREDNISONE 20 MG PO TABS: 40.0000 mg | ORAL_TABLET | Freq: Every day | ORAL | 0 refills | Status: AC

## 2022-09-28 MED ORDER — FLUTICASONE PROPIONATE 50 MCG/ACT NA SUSP: 1.0000 | Freq: Every day | NASAL | 0 refills | Status: AC

## 2022-09-28 NOTE — ED Provider Notes (Signed)
[facility]    CSN: [id number] Arrival date & time: 09/28/22  1118      History   Chief Complaint Chief Complaint  Patient presents with   Ear Fullness    HPI Jamell Opfer is a 62 y.o. female presents for evaluation of ear pain.  Patient states she had a sinus infection a couple months ago and ever since then has had residual bilateral ear pain right greater than left.  Does report some popping and crackling from the ears.  Denies change in hearing or ear drainage.  No fevers.  She has used some sweet oil and taking sinus allergy medication without improvement.  No other concerns at this time   Ear Fullness    Past Medical History:  Diagnosis Date   Allergy    Anxiety    Depression    Hypertension    Thyroid disease     Patient Active Problem List   Diagnosis Date Noted   Insomnia 09/03/2021   Essential hypertension 08/31/2021   Routine adult health maintenance 03/25/2017   Impaired fasting blood sugar 10/29/2016   Pes planus 09/13/2016   Depression 04/27/2016   Allergic rhinitis 04/27/2016   Hypothyroidism 04/27/2016   Elevated ALT measurement 04/27/2016    Past Surgical History:  Procedure Laterality Date   ANKLE SURGERY Right 02/10/2018   APPENDECTOMY     CHOLECYSTECTOMY     HERNIA REPAIR  11/09/2019   with blokage    OB History   No obstetric history on file.      Home Medications    Prior to Admission medications   Medication Sig Start Date End Date Taking? Authorizing Provider  albuterol (VENTOLIN HFA) 108 (90 Base) MCG/ACT inhaler Inhale 2 puffs into the lungs every 6 (six) hours as needed for wheezing or shortness of breath. 09/02/22  Yes Myrlene Broker, MD  buPROPion (WELLBUTRIN) 75 MG tablet Take 1 tablet (75 mg total) by mouth daily. 09/02/22  Yes Myrlene Broker, MD  escitalopram (LEXAPRO) 20 MG tablet Take 1.5 tablets (30 mg total) by mouth daily. 09/02/22  Yes Myrlene Broker, MD  fluticasone [facility]) 50  MCG/ACT nasal spray Place 1 spray into both nostrils daily. 09/28/22  Yes Radford Pax, NP  levothyroxine (SYNTHROID) 100 MCG tablet Take 1 tablet (100 mcg total) by mouth daily. 09/08/22  Yes Myrlene Broker, MD  lisinopril-hydrochlorothiazide (ZESTORETIC) 20-25 MG tablet Take 1 tablet by mouth daily. 09/02/22  Yes Myrlene Broker, MD  metoprolol tartrate (LOPRESSOR) 50 MG tablet Take 1 tablet (50 mg total) by mouth 2 (two) times daily. 09/02/22  Yes Myrlene Broker, MD  Pitavastatin Calcium 2 MG TABS Take 1 tablet (2 mg total) by mouth daily. 09/13/22  Yes Myrlene Broker, MD  predniSONE (DELTASONE) 20 MG tablet Take 2 tablets (40 mg total) by mouth daily with breakfast for 5 days. 09/28/22 10/03/22 Yes Radford Pax, NP  zolpidem (AMBIEN) 10 MG tablet TAKE 1 TABLET BY MOUTH AT BEDTIME AS NEEDED FOR SLEEP 09/27/22  Yes Myrlene Broker, MD    Family History Family History  Problem Relation Age of Onset   Cancer Mother        lung   Heart disease Mother    Hypertension Mother    Heart disease Father    Hypertension Father    Hypertension Sister    Hypertension Brother    Colon cancer Neg Hx    Esophageal cancer Neg Hx    Rectal  cancer Neg Hx    Stomach cancer Neg Hx     Social History Social History   Tobacco Use   Smoking status: Former    Types: Cigarettes    Quit date: 04/26/2016    Years since quitting: 6.4   Smokeless tobacco: Never  Vaping Use   Vaping Use: Never used  Substance Use Topics   Alcohol use: No   Drug use: No     Allergies   Codeine   Review of Systems Review of Systems  HENT:  Positive for ear pain.      Physical Exam Triage Vital Signs ED Triage Vitals  Enc Vitals Group     BP 09/28/22 1145 134/81     Pulse Rate 09/28/22 1145 (!) 58     Resp 09/28/22 1145 18     Temp 09/28/22 1145 98 F (36.7 C)     Temp Source 09/28/22 1145 Oral     SpO2 09/28/22 1145 97 %     Weight --      Height --      Head Circumference  --      Peak Flow --      Pain Score 09/28/22 1141 3     Pain Loc --      Pain Edu? --      Excl. in GC? --    No data found.  Updated Vital Signs BP 134/81 (BP Location: Right Arm)   Pulse (!) 58   Temp 98 F (36.7 C) (Oral)   Resp 18   SpO2 97%   Visual Acuity Right Eye Distance:   Left Eye Distance:   Bilateral Distance:    Right Eye Near:   Left Eye Near:    Bilateral Near:     Physical Exam Vitals and nursing note reviewed.  Constitutional:      General: She is not in acute distress.    Appearance: She is well-developed. She is not ill-appearing.  HENT:     Head: Normocephalic and atraumatic.     Right Ear: Ear canal normal. A middle ear effusion is present. There is no impacted cerumen. Tympanic membrane is not erythematous.     Left Ear: Ear canal normal. A middle ear effusion is present. There is no impacted cerumen. Tympanic membrane is not erythematous.     Mouth/Throat:     Mouth: Mucous membranes are moist.     Pharynx: Oropharynx is clear. Uvula midline.     Tonsils: No tonsillar exudate or tonsillar abscesses.  Eyes:     Conjunctiva/sclera: Conjunctivae normal.     Pupils: Pupils are equal, round, and reactive to light.  Cardiovascular:     Rate and Rhythm: Normal rate and regular rhythm.     Heart sounds: Normal heart sounds.  Pulmonary:     Effort: Pulmonary effort is normal.     Breath sounds: Normal breath sounds.  Musculoskeletal:     Cervical back: Normal range of motion and neck supple.  Lymphadenopathy:     Cervical: No cervical adenopathy.  Skin:    General: Skin is warm and dry.  Neurological:     General: No focal deficit present.     Mental Status: She is alert and oriented to person, place, and time.  Psychiatric:        Mood and Affect: Mood normal.        Behavior: Behavior normal.      UC Treatments / Results  Labs (all labs ordered are listed,  but only abnormal results are displayed) Labs Reviewed - No data to  display  EKG   Radiology No results found.  Procedures Procedures (including critical care time)  Medications Ordered in UC Medications - No data to display  Initial Impression / Assessment and Plan / UC Course  I have reviewed the triage vital signs and the nursing notes.  Pertinent labs & imaging results that were available during my care of the patient were reviewed by me and considered in my medical decision making (see chart for details).     Patient is well-appearing and in no acute distress.  Reviewed exam and symptoms.  Discussed eustachian tube dysfunction Trial of Flonase and prednisone Follow-up with PCP if symptoms do not improve ER precautions reviewed and patient verbalized understanding Final Clinical Impressions(s) / UC Diagnoses   Final diagnoses:  Dysfunction of both eustachian tubes     Discharge Instructions      Flonase daily Prednisone daily for 5 days Follow-up with your PCP if your symptoms do not improve Please go to the ER for any worsening symptoms   ED Prescriptions     Medication Sig Dispense Auth. Provider   fluticasone (FLONASE) 50 MCG/ACT nasal spray Place 1 spray into both nostrils daily. 15.8 mL Radford Pax, NP   predniSONE (DELTASONE) 20 MG tablet Take 2 tablets (40 mg total) by mouth daily with breakfast for 5 days. 10 tablet Radford Pax, NP      PDMP not reviewed this encounter.   Radford Pax, NP 09/28/22 1210

## 2022-09-28 NOTE — ED Triage Notes (Signed)
Pt c/o fullness to bilat ears; R side greater than L x2-3 months following a sinus infection. Denies any cough/sore throat. Recently started using drops of sweet oil without relief. Also takes Tylenol Sinus and allergy pill.

## 2022-09-28 NOTE — Discharge Instructions (Signed)
Flonase daily Prednisone daily for 5 days Follow-up with your PCP if your symptoms do not improve Please go to the ER for any worsening symptoms

## 2023-02-04 ENCOUNTER — Other Ambulatory Visit: Payer: Self-pay | Admitting: Internal Medicine

## 2023-02-23 ENCOUNTER — Other Ambulatory Visit: Payer: Self-pay | Admitting: Internal Medicine

## 2023-04-15 ENCOUNTER — Other Ambulatory Visit: Payer: Self-pay | Admitting: Internal Medicine

## 2023-04-16 ENCOUNTER — Other Ambulatory Visit: Payer: Self-pay | Admitting: Internal Medicine

## 2023-05-23 ENCOUNTER — Other Ambulatory Visit: Payer: Self-pay | Admitting: Internal Medicine

## 2023-07-07 ENCOUNTER — Other Ambulatory Visit: Payer: Self-pay | Admitting: Internal Medicine

## 2023-08-13 ENCOUNTER — Other Ambulatory Visit: Payer: Self-pay | Admitting: Internal Medicine

## 2023-09-29 ENCOUNTER — Other Ambulatory Visit: Payer: Self-pay | Admitting: Internal Medicine

## 2023-12-21 ENCOUNTER — Other Ambulatory Visit: Payer: Self-pay | Admitting: Internal Medicine

## 2024-01-12 ENCOUNTER — Other Ambulatory Visit: Payer: Self-pay | Admitting: Internal Medicine

## 2024-01-16 ENCOUNTER — Other Ambulatory Visit: Payer: Self-pay | Admitting: Internal Medicine
